# Patient Record
Sex: Male | Born: 1975 | Race: Black or African American | Hispanic: No | Smoking: Former smoker
Health system: Southern US, Community
[De-identification: ages and names within clinical notes are randomized; demographics above are authoritative.]

## PROBLEM LIST (undated history)

## (undated) DIAGNOSIS — M109 Gout, unspecified: Secondary | ICD-10-CM

## (undated) DIAGNOSIS — Z789 Other specified health status: Secondary | ICD-10-CM

## (undated) DIAGNOSIS — G4733 Obstructive sleep apnea (adult) (pediatric): Secondary | ICD-10-CM

## (undated) DIAGNOSIS — K819 Cholecystitis, unspecified: Secondary | ICD-10-CM

## (undated) DIAGNOSIS — T7840XA Allergy, unspecified, initial encounter: Secondary | ICD-10-CM

## (undated) DIAGNOSIS — J342 Deviated nasal septum: Secondary | ICD-10-CM

## (undated) DIAGNOSIS — I10 Essential (primary) hypertension: Secondary | ICD-10-CM

## (undated) DIAGNOSIS — E119 Type 2 diabetes mellitus without complications: Secondary | ICD-10-CM

## (undated) DIAGNOSIS — K219 Gastro-esophageal reflux disease without esophagitis: Secondary | ICD-10-CM

## (undated) HISTORY — DX: Allergy, unspecified, initial encounter: T78.40XA

## (undated) HISTORY — DX: Essential (primary) hypertension: I10

## (undated) HISTORY — DX: Deviated nasal septum: J34.2

## (undated) HISTORY — DX: Cholecystitis, unspecified: K81.9

## (undated) HISTORY — DX: Obstructive sleep apnea (adult) (pediatric): G47.33

## (undated) HISTORY — PX: NO PAST SURGERIES: SHX2092

## (undated) HISTORY — DX: Gastro-esophageal reflux disease without esophagitis: K21.9

## (undated) HISTORY — DX: Type 2 diabetes mellitus without complications: E11.9

## (undated) HISTORY — DX: Gout, unspecified: M10.9

## (undated) HISTORY — PX: WISDOM TOOTH EXTRACTION: SHX21

---

## 2005-05-12 ENCOUNTER — Encounter: Admission: RE | Admit: 2005-05-12 | Discharge: 2005-05-12 | Payer: Self-pay | Admitting: Emergency Medicine

## 2011-04-13 DIAGNOSIS — J310 Chronic rhinitis: Secondary | ICD-10-CM | POA: Insufficient documentation

## 2013-06-15 ENCOUNTER — Ambulatory Visit: Payer: Self-pay | Admitting: Family Medicine

## 2015-05-07 ENCOUNTER — Ambulatory Visit
Admission: EM | Admit: 2015-05-07 | Discharge: 2015-05-07 | Disposition: A | Discharge status: Home / Self Care | Payer: 59 | Attending: Family Medicine | Admitting: Family Medicine

## 2015-05-07 DIAGNOSIS — J0100 Acute maxillary sinusitis, unspecified: Secondary | ICD-10-CM

## 2015-05-07 DIAGNOSIS — H6500 Acute serous otitis media, unspecified ear: Secondary | ICD-10-CM | POA: Diagnosis not present

## 2015-05-07 HISTORY — DX: Other specified health status: Z78.9

## 2015-05-07 MED ORDER — AZITHROMYCIN 250 MG PO TABS: ORAL_TABLET | ORAL | Status: DC

## 2015-05-07 NOTE — ED Provider Notes (Signed)
CSN: [id number]     Arrival date & time 05/07/15  1532 History   First MD Initiated Contact with Patient 05/07/15 1839     Chief Complaint  Patient presents with  . Sinusitis  . Otalgia   (Consider location/radiation/quality/duration/timing/severity/associated sxs/prior Treatment) Patient is a 40 y.o. male presenting with URI. The history is provided by the patient.  URI Presenting symptoms: congestion, cough, ear pain, facial pain and fatigue   Severity:  Moderate Onset quality:  Sudden Duration:  5 days Timing:  Constant Progression:  Worsening Chronicity:  New Relieved by:  Nothing Ineffective treatments:  OTC medications Associated symptoms: headaches and sinus pain     Past Medical History  Diagnosis Date  . Patient denies medical problems    Past Surgical History  Procedure Laterality Date  . No past surgeries     History reviewed. No pertinent family history. Social History  Substance Use Topics  . Smoking status: Never Smoker   . Smokeless tobacco: None  . Alcohol Use: 0.0 oz/week    0 Standard drinks or equivalent per week     Comment: a couple beers weekly    Review of Systems  Constitutional: Positive for fatigue.  HENT: Positive for congestion and ear pain.   Respiratory: Positive for cough.   Neurological: Positive for headaches.    Allergies  Amoxil  Home Medications   Prior to Admission medications   Medication Sig Start Date End Date Taking? Authorizing Provider  azithromycin (ZITHROMAX Z-PAK) 250 MG tablet 2 tabs po once day 1, then 1 tab po qd for next 4 days 05/07/15   Norval Gable, MD   Meds Ordered and Administered this Visit  Medications - No data to display  BP 142/88 mmHg  Pulse 86  Temp(Src) 98 F (36.7 C) (Tympanic)  Resp 17  Ht 6' 1.5" (1.867 m)  Wt 327 lb (148.326 kg)  BMI 42.55 kg/m2  SpO2 98% No data found.   Physical Exam  Constitutional: He appears well-developed and well-nourished. No distress.  HENT:  Head:  Normocephalic and atraumatic.  Right Ear: Tympanic membrane, external ear and ear canal normal.  Left Ear: External ear and ear canal normal. Tympanic membrane is injected, erythematous and bulging. A middle ear effusion is present.  Nose: Right sinus exhibits maxillary sinus tenderness and frontal sinus tenderness. Left sinus exhibits maxillary sinus tenderness and frontal sinus tenderness.  Mouth/Throat: Uvula is midline, oropharynx is clear and moist and mucous membranes are normal. No oropharyngeal exudate or tonsillar abscesses.  Eyes: Conjunctivae and EOM are normal. Pupils are equal, round, and reactive to light. Right eye exhibits no discharge. Left eye exhibits no discharge. No scleral icterus.  Neck: Normal range of motion. Neck supple. No tracheal deviation present. No thyromegaly present.  Cardiovascular: Normal rate, regular rhythm and normal heart sounds.   Pulmonary/Chest: Effort normal and breath sounds normal. No stridor. No respiratory distress. He has no wheezes. He has no rales. He exhibits no tenderness.  Lymphadenopathy:    He has no cervical adenopathy.  Neurological: He is alert.  Skin: Skin is warm and dry. No rash noted. He is not diaphoretic.  Nursing note and vitals reviewed.   ED Course  Procedures (including critical care time)  Labs Review Labs Reviewed - No data to display  Imaging Review No results found.   Visual Acuity Review  Right Eye Distance:   Left Eye Distance:   Bilateral Distance:    Right Eye Near:   Left Eye Near:  Bilateral Near:         MDM   1. Acute serous otitis media, recurrence not specified, unspecified laterality   2. Acute maxillary sinusitis, recurrence not specified    Discharge Medication List as of 05/07/2015  6:46 PM    START taking these medications   Details  azithromycin (ZITHROMAX Z-PAK) 250 MG tablet 2 tabs po once day 1, then 1 tab po qd for next 4 days, Normal       1. diagnosis reviewed with  patient 2. rx as per orders above; reviewed possible side effects, interactions, risks and benefits  3. Recommend supportive treatment with otc analgesics, otc flonase 4. Follow-up prn if symptoms worsen or Dahmir't improve    Norval Gable, MD 05/07/15 [phone]

## 2015-05-07 NOTE — ED Notes (Signed)
Patient complains of ear pressure and coughing. States that this started on Thursday after a 6 hour flight from [place]. Patient states that he has been having a sore throat and states that he attributes this to the drainage. Patient states that he has sinus pressure and ear popping as well.

## 2018-05-05 HISTORY — PX: CHOLECYSTECTOMY: SHX55

## 2018-05-07 DIAGNOSIS — Z860100 Personal history of colon polyps, unspecified: Secondary | ICD-10-CM

## 2018-05-07 DIAGNOSIS — K635 Polyp of colon: Secondary | ICD-10-CM | POA: Insufficient documentation

## 2018-05-07 DIAGNOSIS — E785 Hyperlipidemia, unspecified: Secondary | ICD-10-CM | POA: Insufficient documentation

## 2018-05-07 DIAGNOSIS — Z9889 Other specified postprocedural states: Secondary | ICD-10-CM | POA: Insufficient documentation

## 2018-05-07 DIAGNOSIS — I10 Essential (primary) hypertension: Secondary | ICD-10-CM | POA: Insufficient documentation

## 2018-05-07 HISTORY — DX: Personal history of colon polyps, unspecified: Z86.0100

## 2018-05-07 HISTORY — DX: Personal history of colonic polyps: Z98.890

## 2018-05-25 DIAGNOSIS — J40 Bronchitis, not specified as acute or chronic: Secondary | ICD-10-CM

## 2018-05-25 HISTORY — DX: Bronchitis, not specified as acute or chronic: J40

## 2018-06-07 DIAGNOSIS — D369 Benign neoplasm, unspecified site: Secondary | ICD-10-CM

## 2018-06-07 HISTORY — DX: Benign neoplasm, unspecified site: D36.9

## 2018-11-04 DIAGNOSIS — M7662 Achilles tendinitis, left leg: Secondary | ICD-10-CM

## 2018-11-04 DIAGNOSIS — Z23 Encounter for immunization: Secondary | ICD-10-CM | POA: Insufficient documentation

## 2018-11-04 DIAGNOSIS — R7303 Prediabetes: Secondary | ICD-10-CM | POA: Insufficient documentation

## 2018-11-04 DIAGNOSIS — E118 Type 2 diabetes mellitus with unspecified complications: Secondary | ICD-10-CM | POA: Insufficient documentation

## 2018-11-04 DIAGNOSIS — Z6841 Body Mass Index (BMI) 40.0 and over, adult: Secondary | ICD-10-CM | POA: Insufficient documentation

## 2018-11-04 HISTORY — DX: Achilles tendinitis, left leg: M76.62

## 2019-04-08 ENCOUNTER — Other Ambulatory Visit: Payer: Self-pay

## 2019-04-08 DIAGNOSIS — Z20822 Contact with and (suspected) exposure to covid-19: Secondary | ICD-10-CM

## 2019-04-10 LAB — NOVEL CORONAVIRUS, NAA: SARS-CoV-2, NAA: NOT DETECTED

## 2019-04-30 DIAGNOSIS — K8050 Calculus of bile duct without cholangitis or cholecystitis without obstruction: Secondary | ICD-10-CM | POA: Insufficient documentation

## 2019-04-30 DIAGNOSIS — Z9049 Acquired absence of other specified parts of digestive tract: Secondary | ICD-10-CM | POA: Insufficient documentation

## 2019-04-30 HISTORY — DX: Acquired absence of other specified parts of digestive tract: Z90.49

## 2019-04-30 HISTORY — DX: Calculus of bile duct without cholangitis or cholecystitis without obstruction: K80.50

## 2019-05-05 DIAGNOSIS — K5909 Other constipation: Secondary | ICD-10-CM | POA: Insufficient documentation

## 2019-05-10 DIAGNOSIS — K8050 Calculus of bile duct without cholangitis or cholecystitis without obstruction: Secondary | ICD-10-CM | POA: Insufficient documentation

## 2019-06-28 DIAGNOSIS — L989 Disorder of the skin and subcutaneous tissue, unspecified: Secondary | ICD-10-CM

## 2019-06-28 HISTORY — DX: Disorder of the skin and subcutaneous tissue, unspecified: L98.9

## 2020-04-07 ENCOUNTER — Other Ambulatory Visit: Payer: 59

## 2020-04-07 DIAGNOSIS — Z20822 Contact with and (suspected) exposure to covid-19: Secondary | ICD-10-CM

## 2020-04-09 LAB — SARS-COV-2, NAA 2 DAY TAT

## 2020-04-09 LAB — NOVEL CORONAVIRUS, NAA: SARS-CoV-2, NAA: NOT DETECTED

## 2021-05-30 ENCOUNTER — Other Ambulatory Visit: Payer: Self-pay

## 2021-05-30 ENCOUNTER — Ambulatory Visit
Admission: RE | Admit: 2021-05-30 | Discharge: 2021-05-30 | Disposition: A | Discharge status: Home / Self Care | Payer: 59 | Attending: Family Medicine | Admitting: Family Medicine

## 2021-05-30 ENCOUNTER — Ambulatory Visit
Admission: RE | Admit: 2021-05-30 | Discharge: 2021-05-30 | Disposition: A | Discharge status: Home / Self Care | Payer: 59 | Source: Ambulatory Visit | Attending: Family Medicine | Admitting: Family Medicine

## 2021-05-30 ENCOUNTER — Ambulatory Visit: Payer: 59 | Admitting: Family Medicine

## 2021-05-30 ENCOUNTER — Encounter: Payer: Self-pay | Admitting: Family Medicine

## 2021-05-30 VITALS — BP 152/102 | HR 72 | Ht 73.5 in | Wt 338.0 lb

## 2021-05-30 DIAGNOSIS — G4733 Obstructive sleep apnea (adult) (pediatric): Secondary | ICD-10-CM

## 2021-05-30 DIAGNOSIS — I10 Essential (primary) hypertension: Secondary | ICD-10-CM | POA: Diagnosis not present

## 2021-05-30 DIAGNOSIS — M5416 Radiculopathy, lumbar region: Secondary | ICD-10-CM

## 2021-05-30 DIAGNOSIS — J310 Chronic rhinitis: Secondary | ICD-10-CM

## 2021-05-30 DIAGNOSIS — J309 Allergic rhinitis, unspecified: Secondary | ICD-10-CM | POA: Diagnosis not present

## 2021-05-30 DIAGNOSIS — Z113 Encounter for screening for infections with a predominantly sexual mode of transmission: Secondary | ICD-10-CM | POA: Insufficient documentation

## 2021-05-30 DIAGNOSIS — R5382 Chronic fatigue, unspecified: Secondary | ICD-10-CM | POA: Diagnosis not present

## 2021-05-30 DIAGNOSIS — Z9989 Dependence on other enabling machines and devices: Secondary | ICD-10-CM

## 2021-05-30 MED ORDER — FLUNISOLIDE 25 MCG/ACT (0.025%) NA SOLN: 2.0000 | Freq: Two times a day (BID) | NASAL | 2 refills | Status: DC

## 2021-05-30 MED ORDER — LOSARTAN POTASSIUM-HCTZ 100-25 MG PO TABS: 1.0000 | ORAL_TABLET | Freq: Every day | ORAL | 2 refills | Status: DC

## 2021-05-30 MED ORDER — CYCLOBENZAPRINE HCL 10 MG PO TABS: 10.0000 mg | ORAL_TABLET | Freq: Three times a day (TID) | ORAL | 0 refills | Status: DC | PRN

## 2021-05-30 NOTE — Assessment & Plan Note (Signed)
Chronic condition ongoing for 3 years, this is in the setting of multiple comorbid medical factors inclusive of OSA with partial compliance of CPAP.  He states that he gets roughly 5 hours of sleep a night attributing this to work-related stressors, during that sleep he is partially compliant with CPAP.  He does state that the nights that he does utilize a CPAP he has excellent sleep.  He has daytime somnolence reported.  Given his comorbid lower extremity paresthesia, plan for review of additional etiologies for his chronic fatigue including TSH, iron, B12, CBC studies.  We will follow these results and review at his next visit.

## 2021-05-30 NOTE — Assessment & Plan Note (Signed)
Chronic issue, uncontrolled.  We discussed the significance of untreated OSA in relation to mood, energy, hypertension, and obesity.  Barrier to compliance include knowledge about the medical condition, comorbid chronic rhinitis.  I have placed a referral to ENT/sleep medicine for further evaluation and management.  He is amenable to restart of consistent usage of his CPAP.

## 2021-05-30 NOTE — Assessment & Plan Note (Signed)
Chronic issue, uncontrolled.  Patient states that he is intermittently compliant with blood pressure medication regimen, denies any chest pain, does have mild shortness of air secondary to chronic rhinitis and recent sinus infection, and does endorse fatigue.  Cardiopulmonary examination today reveals positive S1 and S2, regular rate and rhythm, no additional heart sounds, clear lung fields throughout without wheezes, bibasilar rales, rhonchi, he has no JVD, no carotid bruits, symmetric pulses in the extremities, he does have trace pitting edema bilaterally at the ankles.  He stated barrier to medication compliance is the number of medications; to get a better gauge of his actual blood pressure following consistent treatment, I have advised combo ARB/HCTZ pill, discontinuation of beta-blocker, risk stratification labs, and close follow-up in 1 month for reevaluation and possible titration of medications.  Additionally, compliance with CPAP should help this issue, we discussed the same.

## 2021-05-30 NOTE — Patient Instructions (Addendum)
-   Obtain fasting labs with orders provided (can have water or black coffee but otherwise no food or drink x 8 hours before labs) - Obtain low back x-rays downstairs today - Start new, or blood pressure medication in place of your 3 others - Start new intranasal steroid in place of Flonase - Can dose cyclobenzaprine on an as-needed basis for low back/leg symptoms - Stop over-the-counter medications containing phenylephrine - Referral coordinator will contact you in regards to follow-up with ENT/sleep medicine and physical therapy  - Return for follow-up in 4 weeks, contact for any questions between now and then

## 2021-05-30 NOTE — Progress Notes (Signed)
Primary Care / Sports Medicine Office Visit  Patient Information:  Patient ID: Travis Petty, male DOB: 10/28/1975 Age: 46 y.o. MRN: [id number]   Travis Petty is a pleasant 46 y.o. male presenting with the following:  Chief Complaint  Patient presents with   New Patient (Initial Visit)   Establish Care    Vitals:   05/30/21 1026  BP: (!) 152/102  Pulse: 72  SpO2: 97%   Vitals:   05/30/21 1026  Weight: (!) 338 lb (153.3 kg)  Height: 6' 1.5" (1.867 m)   Body mass index is 43.99 kg/m.  No results found.   Independent interpretation of notes and tests performed by another provider:   None  Procedures performed:   None  Pertinent History, Exam, Impression, and Recommendations:   Chronic rhinitis Chronic issue, ongoing for years, has had multiple episodes of sinus infections, this is also noted in the setting of deviated septum.  He has recently completed a course of antibiotics from the late 04/2021 timeframe, has overall noted improvement but has consistent drainage that he has been addressing with as needed dosing of Flonase and guaifenesin with phenylephrine.  He has audible congestion, clearing of nares, throat with clear lung fields throughout.  I have advised a transition from Flonase to Rx flunisolide to dose daily, discontinuation of phenylephrine containing substances due to comorbid hypertension, compliance with CPAP nightly, and a referral to ENT/sleep medicine for additional evaluation management options.  Primary hypertension Chronic issue, uncontrolled.  Patient states that he is intermittently compliant with blood pressure medication regimen, denies any chest pain, does have mild shortness of air secondary to chronic rhinitis and recent sinus infection, and does endorse fatigue.  Cardiopulmonary examination today reveals positive S1 and S2, regular rate and rhythm, no additional heart sounds, clear lung fields throughout without wheezes, bibasilar  rales, rhonchi, he has no JVD, no carotid bruits, symmetric pulses in the extremities, he does have trace pitting edema bilaterally at the ankles.  He stated barrier to medication compliance is the number of medications; to get a better gauge of his actual blood pressure following consistent treatment, I have advised combo ARB/HCTZ pill, discontinuation of beta-blocker, risk stratification labs, and close follow-up in 1 month for reevaluation and possible titration of medications.  Additionally, compliance with CPAP should help this issue, we discussed the same.  OSA on CPAP Chronic issue, uncontrolled.  We discussed the significance of untreated OSA in relation to mood, energy, hypertension, and obesity.  Barrier to compliance include knowledge about the medical condition, comorbid chronic rhinitis.  I have placed a referral to ENT/sleep medicine for further evaluation and management.  He is amenable to restart of consistent usage of his CPAP.  Chronic left-sided lumbar radiculopathy Chronic issue, atraumatic in nature, ongoing since 2016 timeframe where he had severe back pain.  Since that time he has noted improvement with intermittent flares every few months-years.  He has had a "sciatica "episode towards the latter part of last year alleviated with skeletal muscle relaxers and stretching.  He currently endorses left lateral foot tingling, intermittent in nature, noted with prolonged standing or sitting.  Examination reveals significantly tight posterior chain musculature, positive straight leg raise on the left, right benign.  Patient's clinical history and findings are most consistent with lumbosacral left-sided radiculopathy.  We will further evaluate this with dedicated x-rays of the lumbar spine.  Due to his comorbid uncontrolled hypertension, will refrain from NSAIDs, plan for as needed skeletal muscle relaxer  which was prescribed today.  Additionally, a referral to physical therapy was placed to  address modifiable risk factors such as flexibility, stabilization, and gentle strengthening.  Encounter for screening examination for sexually transmitted disease Patient denies any active symptoms inclusive of pelvic or abdominal pain, dysuria, urinary discharge, nausea, emesis, change in bowels, no skin lesions.  He does state that he has had multiple sexual partners over the past years and is interested in testing.  Appropriate testing ordered today.  Chronic fatigue Chronic condition ongoing for 3 years, this is in the setting of multiple comorbid medical factors inclusive of OSA with partial compliance of CPAP.  He states that he gets roughly 5 hours of sleep a night attributing this to work-related stressors, during that sleep he is partially compliant with CPAP.  He does state that the nights that he does utilize a CPAP he has excellent sleep.  He has daytime somnolence reported.  Given his comorbid lower extremity paresthesia, plan for review of additional etiologies for his chronic fatigue including TSH, iron, B12, CBC studies.  We will follow these results and review at his next visit.   I provided a total time of 62 minutes including both face-to-face and non-face-to-face time on 05/30/2021 inclusive of time utilized for medical chart review, information gathering, care coordination with staff, and documentation completion.  Specifically, we discussed the interplay of obstructive sleep apnea and obesity, hypertension, fatigue, and mood.  Additionally, how to address barriers to medication and CPAP compliance, discussion about appropriate work-up and treatment options, coming up with a shared decision for medical plan.   Orders & Medications Meds ordered this encounter  Medications   losartan-hydrochlorothiazide (HYZAAR) 100-25 MG tablet    Sig: Take 1 tablet by mouth daily.    Dispense:  30 tablet    Refill:  2   flunisolide (NASALIDE) 25 MCG/ACT (0.025%) SOLN    Sig: Place 2 sprays  into the nose 2 (two) times daily.    Dispense:  25 mL    Refill:  2   cyclobenzaprine (FLEXERIL) 10 MG tablet    Sig: Take 1 tablet (10 mg total) by mouth 3 (three) times daily as needed for muscle spasms.    Dispense:  30 tablet    Refill:  0   Orders Placed This Encounter  Procedures   GC/Chlamydia Probe Amp   DG Lumbar Spine Complete   Lipid panel   Apo A1 + B + Ratio   TSH   Comprehensive metabolic panel   CBC with Differential/Platelet   Iron, TIBC and Ferritin Panel   B12 and Folate Panel   HepB+HepC+HIV Panel   Ambulatory referral to ENT   Ambulatory referral to Physical Therapy     Return in about 4 weeks (around 06/27/2021).     [name], MD   Primary Care Sports [facility] [place]

## 2021-05-30 NOTE — Assessment & Plan Note (Signed)
Chronic issue, atraumatic in nature, ongoing since 2016 timeframe where he had severe back pain.  Since that time he has noted improvement with intermittent flares every few months-years.  He has had a "sciatica "episode towards the latter part of last year alleviated with skeletal muscle relaxers and stretching.  He currently endorses left lateral foot tingling, intermittent in nature, noted with prolonged standing or sitting.  Examination reveals significantly tight posterior chain musculature, positive straight leg raise on the left, right benign.  Patient's clinical history and findings are most consistent with lumbosacral left-sided radiculopathy.  We will further evaluate this with dedicated x-rays of the lumbar spine.  Due to his comorbid uncontrolled hypertension, will refrain from NSAIDs, plan for as needed skeletal muscle relaxer which was prescribed today.  Additionally, a referral to physical therapy was placed to address modifiable risk factors such as flexibility, stabilization, and gentle strengthening.

## 2021-05-30 NOTE — Assessment & Plan Note (Signed)
Patient denies any active symptoms inclusive of pelvic or abdominal pain, dysuria, urinary discharge, nausea, emesis, change in bowels, no skin lesions.  He does state that he has had multiple sexual partners over the past years and is interested in testing.  Appropriate testing ordered today.

## 2021-05-30 NOTE — Assessment & Plan Note (Signed)
Chronic issue, ongoing for years, has had multiple episodes of sinus infections, this is also noted in the setting of deviated septum.  He has recently completed a course of antibiotics from the late 04/2021 timeframe, has overall noted improvement but has consistent drainage that he has been addressing with as needed dosing of Flonase and guaifenesin with phenylephrine.  He has audible congestion, clearing of nares, throat with clear lung fields throughout.  I have advised a transition from Flonase to Rx flunisolide to dose daily, discontinuation of phenylephrine containing substances due to comorbid hypertension, compliance with CPAP nightly, and a referral to ENT/sleep medicine for additional evaluation management options.

## 2021-05-31 LAB — HEPB+HEPC+HIV PANEL
HIV Screen 4th Generation wRfx: NONREACTIVE
Hep B C IgM: NEGATIVE
Hep B Core Total Ab: NEGATIVE
Hep B E Ab: NEGATIVE
Hep B E Ag: NEGATIVE
Hep B Surface Ab, Qual: NONREACTIVE
Hep C Virus Ab: <0.1 s/co ratio (ref 0.0–0.9)
Hepatitis B Surface Ag: NEGATIVE

## 2021-05-31 LAB — B12 AND FOLATE PANEL
Folate: 7.9 ng/mL (ref >3.0)
Vitamin B-12: 462 pg/mL (ref 232–1245)

## 2021-05-31 LAB — LIPID PANEL
Chol/HDL Ratio: 4.2 ratio (ref 0.0–5.0)
Cholesterol, Total: 191 mg/dL (ref 100–199)
HDL: 45 mg/dL (ref >39)
LDL Chol Calc (NIH): 123 mg/dL — ABNORMAL HIGH (ref 0–99)
Triglycerides: 127 mg/dL (ref 0–149)
VLDL Cholesterol Cal: 23 mg/dL (ref 5–40)

## 2021-05-31 LAB — CBC WITH DIFFERENTIAL/PLATELET
Basophils Absolute: 0 10*3/uL (ref 0.0–0.2)
Basos: 1 %
EOS (ABSOLUTE): 0.2 10*3/uL (ref 0.0–0.4)
Eos: 3 %
Hematocrit: 43.4 % (ref 37.5–51.0)
Hemoglobin: 15.3 g/dL (ref 13.0–17.7)
Immature Grans (Abs): 0 10*3/uL (ref 0.0–0.1)
Immature Granulocytes: 0 %
Lymphocytes Absolute: 1.9 10*3/uL (ref 0.7–3.1)
Lymphs: 34 %
MCH: 28.7 pg (ref 26.6–33.0)
MCHC: 35.3 g/dL (ref 31.5–35.7)
MCV: 81 fL (ref 79–97)
Monocytes Absolute: 0.3 10*3/uL (ref 0.1–0.9)
Monocytes: 6 %
Neutrophils Absolute: 3.1 10*3/uL (ref 1.4–7.0)
Neutrophils: 56 %
Platelets: 256 10*3/uL (ref 150–450)
RBC: 5.34 x10E6/uL (ref 4.14–5.80)
RDW: 13.4 % (ref 11.6–15.4)
WBC: 5.5 10*3/uL (ref 3.4–10.8)

## 2021-05-31 LAB — COMPREHENSIVE METABOLIC PANEL
ALT: 26 IU/L (ref 0–44)
AST: 18 IU/L (ref 0–40)
Albumin/Globulin Ratio: 1.3 (ref 1.2–2.2)
Albumin: 4.3 g/dL (ref 4.0–5.0)
Alkaline Phosphatase: 82 IU/L (ref 44–121)
BUN/Creatinine Ratio: 9 (ref 9–20)
BUN: 10 mg/dL (ref 6–24)
Bilirubin Total: 1.2 mg/dL (ref 0.0–1.2)
CO2: 28 mmol/L (ref 20–29)
Calcium: 9.5 mg/dL (ref 8.7–10.2)
Chloride: 99 mmol/L (ref 96–106)
Creatinine, Ser: 1.07 mg/dL (ref 0.76–1.27)
Globulin, Total: 3.2 g/dL (ref 1.5–4.5)
Glucose: 127 mg/dL — ABNORMAL HIGH (ref 70–99)
Potassium: 3.9 mmol/L (ref 3.5–5.2)
Sodium: 139 mmol/L (ref 134–144)
Total Protein: 7.5 g/dL (ref 6.0–8.5)
eGFR: 87 mL/min/{1.73_m2} (ref >59)

## 2021-05-31 LAB — TSH: TSH: 1.91 u[IU]/mL (ref 0.450–4.500)

## 2021-05-31 LAB — IRON,TIBC AND FERRITIN PANEL
Ferritin: 188 ng/mL (ref 30–400)
Iron Saturation: 38 % (ref 15–55)
Iron: 102 ug/dL (ref 38–169)
Total Iron Binding Capacity: 266 ug/dL (ref 250–450)
UIBC: 164 ug/dL (ref 111–343)

## 2021-05-31 LAB — APO A1 + B + RATIO
Apolipo. B/A-1 Ratio: 0.7 ratio (ref 0.0–0.7)
Apolipoprotein A-1: 134 mg/dL (ref 101–178)
Apolipoprotein B: 96 mg/dL — ABNORMAL HIGH (ref <90)

## 2021-06-01 ENCOUNTER — Encounter: Payer: Self-pay | Admitting: Family Medicine

## 2021-06-01 LAB — HGB A1C W/O EAG: Hgb A1c MFr Bld: 6.8 % — ABNORMAL HIGH (ref 4.8–5.6)

## 2021-06-01 LAB — SPECIMEN STATUS REPORT

## 2021-06-03 ENCOUNTER — Other Ambulatory Visit: Payer: Self-pay | Admitting: Family Medicine

## 2021-06-03 DIAGNOSIS — E118 Type 2 diabetes mellitus with unspecified complications: Secondary | ICD-10-CM

## 2021-06-03 LAB — GC/CHLAMYDIA PROBE AMP
Chlamydia trachomatis, NAA: NEGATIVE
Neisseria Gonorrhoeae by PCR: NEGATIVE

## 2021-06-03 MED ORDER — METFORMIN HCL ER 500 MG PO TB24: 500.0000 mg | ORAL_TABLET | Freq: Every day | ORAL | 2 refills | Status: DC

## 2021-06-03 NOTE — Telephone Encounter (Signed)
Please advise for lab results.

## 2021-06-04 ENCOUNTER — Ambulatory Visit: Payer: 59 | Attending: Family Medicine

## 2021-06-04 ENCOUNTER — Other Ambulatory Visit: Payer: Self-pay

## 2021-06-04 DIAGNOSIS — M6281 Muscle weakness (generalized): Secondary | ICD-10-CM | POA: Insufficient documentation

## 2021-06-04 DIAGNOSIS — G8929 Other chronic pain: Secondary | ICD-10-CM | POA: Diagnosis present

## 2021-06-04 DIAGNOSIS — M5442 Lumbago with sciatica, left side: Secondary | ICD-10-CM | POA: Diagnosis present

## 2021-06-04 DIAGNOSIS — M5416 Radiculopathy, lumbar region: Secondary | ICD-10-CM | POA: Diagnosis present

## 2021-06-04 NOTE — Patient Instructions (Signed)
Access Code: [email or web address] URL: [email or web address] Date: 06/04/2021 Prepared by: Roxana Hires  Exercises Supine Figure 4 Piriformis Stretch - 1 x daily - 7 x weekly - 3 sets - 30-45s hold Supine Piriformis Stretch with Foot on Ground - 1 x daily - 7 x weekly - 3 sets - 30-45s hold Seated Flexion Stretch - 1 x daily - 7 x weekly - 3 sets - 30-45s hold
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
## 2021-06-11 ENCOUNTER — Encounter: Payer: Self-pay | Admitting: Family Medicine

## 2021-06-11 ENCOUNTER — Ambulatory Visit: Payer: 59 | Attending: Family Medicine

## 2021-06-11 ENCOUNTER — Other Ambulatory Visit: Payer: Self-pay

## 2021-06-11 DIAGNOSIS — M5416 Radiculopathy, lumbar region: Secondary | ICD-10-CM | POA: Diagnosis not present

## 2021-06-11 DIAGNOSIS — G8929 Other chronic pain: Secondary | ICD-10-CM | POA: Diagnosis present

## 2021-06-11 DIAGNOSIS — M5442 Lumbago with sciatica, left side: Secondary | ICD-10-CM | POA: Diagnosis present

## 2021-06-11 DIAGNOSIS — M6281 Muscle weakness (generalized): Secondary | ICD-10-CM | POA: Diagnosis present

## 2021-06-11 DIAGNOSIS — I10 Essential (primary) hypertension: Secondary | ICD-10-CM

## 2021-06-11 MED ORDER — LOSARTAN POTASSIUM-HCTZ 100-25 MG PO TABS: 1.0000 | ORAL_TABLET | Freq: Every day | ORAL | 2 refills | Status: DC

## 2021-06-11 NOTE — Therapy (Addendum)
[facility] [address] [place], [place], [address] Phone: [phone]   Fax:  [phone]  Physical Therapy Treatment  Patient Details  Name: Travis Petty MRN: [id number] Date of Birth: [birth year] Referring Provider (PT): Dr. Zigmund Daniel   Encounter Date: 06/11/2021   PT End of Session - 06/11/21 1253     Visit Number 2    Number of Visits 13    Date for PT Re-Evaluation 07/16/21    Authorization Type eval: 06/04/2021    PT Start Time 1145    PT Stop Time 1230    PT Time Calculation (min) 45 min    Activity Tolerance Patient tolerated treatment well    Behavior During Therapy [facility] for tasks assessed/performed             Past Medical History:  Diagnosis Date   Allergy    Bronchitis 05/25/2018   Last Assessment & Plan:  Formatting of this note might be different from the original. Reviewed hx, sx, exam with pt Proceed with antibx With recent doxycycline and allergies, will proceed with rx levofloxacin. Also included short burst of po steroid. If symptoms not improved in next 3-5 days with treatment/plan recommended, patient was advised to follow up with office for further recommendations.   Cholecystitis    Deviated septum    GERD (gastroesophageal reflux disease)    Gout    Left Achilles tendinitis 11/04/2018   Last Assessment & Plan:  Formatting of this note might be different from the original. Follow up with Ortho   OSA on CPAP    Skin lesions 06/28/2019   Last Assessment & Plan:  Formatting of this note might be different from the original. This appears benign just monitor.    Past Surgical History:  Procedure Laterality Date   CHOLECYSTECTOMY  2020   WISDOM TOOTH EXTRACTION      There were no vitals filed for this visit.   Subjective Assessment - 06/11/21 1148     Subjective Pt had flare up of gout and swelling of L foot over the weekend. Swelling and pain have decreased, but not entirely gone. Pt reports no  flare ups of back or thigh during his car trip to [place]. Pt states the tingling in the 2nd toe is still present.    Pertinent History Pt has history of L side lumbar radiculopathy / sciatica for several years that presents as occasional bouts of sharp lower L lumbar pain and an intense burning pain down the posterior aspect of his L thigh. Current bout of pain has been on and off for the past few months. This bout has also presented itself with semi-constant tingling in his 4th phalanx of his L foot. L foot does not have any sensation loss or pain, but pt describes the sensation "like electricity buzzing". Lower back and thigh pain only occurs occasionally and when not acting up pt reports 0/10 pain. For this current episode pt went to his provider and they prescribed him muscle relaxers which has not helped. Pt not a big fan of medicine so only takes the muscle relaxers when the pain becomes unbearable. Motions that cause the L lower back and thigh pain to flare up are: sitting too long in an uncomfortable chair, walking for long periods of time, exercising at the gym, twisting motions, and engaging in sexual activity with his girlfriend. When pain increases pt mainly utilizes stretches to help it subside. Pt has a lot of traveling  coming up and does not want to experience a flare up during travel. Pt works for a [employer] and is sitting most of the day at work. Pt enjoys being active and exercising.    Limitations Sitting;Standing;Walking              TREATMENT   Manual Therapy: CPA Lumbar L1-L5 mobilizations grade II-III 30s/bouts x 2 bouts L A/P Hip mobilizations grade II-III 30s/bouts x 2 bouts L M/L Hip mobilizations with belt grade II-III 30s/bouts x 2 bouts L Hip Long Axis distraction with belt 30s/bouts x 2 bouts SLR passive stretch with added nerve glide hold 5s x 5 Seated slump nerve glide hold 5s x 5 HEP issued;   There Ex: R SLR x 10; Seated marches x  20;   Trigger Point Dry Needling (TDN), unbilled Education performed with patient regarding potential benefit of TDN. Reviewed precautions and risks with patient. Extensive time spent with pt to ensure full understanding of TDN risks. Pt provided verbal consent to treatment. TDN performed to bilateral lumbar multifidi at L4 with 2, 0.35 x 75 single needle placements (one on each side). Pistoning technique utilized.    Pt educated throughout session about proper posture and technique with exercises. Improved exercise technique, movement at target joints, use of target muscles after min to mod verbal, visual, tactile cues.    Focused session on increasing ROM with mobs throughout the lumbar spine and L hip. Pt reported only a slight discomfort during lumbar mobs, but denied any pain. Pt reported feeling a slight relief of tingling in the 2nd phalanx during L long axis distraction. Performed 2 nerve glides to help release some of the tension within the nerve along the back on the L leg. During seated marches pt's R leg externally rotates as he flexes the hip up. Added exercises and a nerve glide to pt's HEP and instructed him on positioning and doseage. Pt would benefit from continued PT services to address deficits in ROM, strength and pain in order to return to full function at home and work.                   PT Short Term Goals - 06/04/21 1342       PT SHORT TERM GOAL #1   Title Pt will be independent with HEP in order to improve strength and decrease L lumbar back pain in order to improve pain-free function at home and work.    Time 3    Period Weeks    Status New    Target Date 06/25/21               PT Long Term Goals - 06/04/21 1457       PT LONG TERM GOAL #1   Title Pt will increase lumbar flexion by 10 degrees to show a significant increase in range of motion in order to increase ability to perform functional activities at work and home like tieing his shoes.     Baseline 06/04/2021: 35    Time 6    Period Weeks    Status New    Target Date 07/16/21      PT LONG TERM GOAL #2   Title Pt will decrease worst back pain as reported on NPRS by at least 2 points in order to demonstrate clinically significant reduction in his L lower back pain.    Baseline Ask next session    Time 6    Period Weeks  Status New    Target Date 07/16/21      PT LONG TERM GOAL #3   Title Pt will increase FOTO to at least 75 in order to demonstrate significant improvement in function related to L low back pain.    Baseline 06/04/2021: 67    Time 6    Period Weeks    Status New    Target Date 07/16/21                   Plan - 06/11/21 1254     Clinical Impression Statement Focused session on increasing ROM with mobs throughout the lumbar spine and L hip. Pt reported only a slight discomfort during lumbar mobs, but denied any pain. Pt reported feeling a slight relief of tingling in the 2nd phalanx during L long axis distraction. Performed 2 nerve glides to help release some of the tension within the nerve along the back on the L leg. During seated marches pt's R leg externally rotates as he flexes the hip up. Added exercises and a nerve glide to pt's HEP and instructed him on positioning and doseage. Pt would benefit from continued PT services to address deficits in ROM, strength and pain in order to return to full function at home and work.    Personal Factors and Comorbidities Comorbidity 1    Comorbidities Chronic left-sided lumbar radiculopathy    Examination-Activity Limitations Sit;Stand;Other    Examination-Participation Restrictions Driving;Interpersonal Relationship;Occupation;Other   Traveling   Stability/Clinical Decision Making Evolving/Moderate complexity    Rehab Potential Good    PT Frequency 2x / week    PT Duration 6 weeks    PT Treatment/Interventions Cryotherapy;Electrical Stimulation;Iontophoresis 4mg /ml Dexamethasone;Moist  Heat;Traction;Ultrasound;Therapeutic activities;Therapeutic exercise;Neuromuscular re-education;Manual techniques;Dry needling;Spinal Manipulations;Joint Manipulations;Passive range of motion    PT Next Visit Plan Complete mODI, work on gentle strengthening and stretching    PT Home Exercise Plan Access code: [email or web address]    Consulted and Agree with Plan of Care Patient             Patient will benefit from skilled therapeutic intervention in order to improve the following deficits and impairments:  Decreased range of motion, Decreased strength, Pain  Visit Diagnosis: Radiculopathy, lumbar region  Chronic left-sided low back pain with left-sided sciatica  Muscle weakness (generalized)     Problem List Patient Active Problem List   Diagnosis Date Noted   OSA on CPAP 05/30/2021   Chronic fatigue 05/30/2021   Allergic rhinitis 05/30/2021   Chronic left-sided lumbar radiculopathy 05/30/2021   Encounter for screening examination for sexually transmitted disease 77/41/2878   Biliary colic 67/67/2094   Other constipation 05/05/2019   Bilirubinemia 04/30/2019   Choledocholithiasis 04/30/2019   S/P laparoscopic cholecystectomy 04/30/2019   BMI 45.0-49.9, adult ([place]) 11/04/2018   Need for Tdap vaccination 11/04/2018   Prediabetes 11/04/2018   Adenomatous polyps 06/07/2018   Colon polyp 05/07/2018   Dyslipidemia 05/07/2018   Primary hypertension 05/07/2018   H/O colonoscopy with polypectomy 05/07/2018   Chronic rhinitis 04/13/2011    Onaje Warne SPT [facility] PT, DPT, GCS  Huprich,Jason, PT 06/11/2021, 2:59 PM  [facility] [address] [place], [place], [address] Phone: [phone]   Fax:  [phone]  Name: Nefi Musich MRN: [id number] Date of Birth: 1976-01-22

## 2021-06-17 ENCOUNTER — Other Ambulatory Visit: Payer: Self-pay

## 2021-06-17 DIAGNOSIS — I10 Essential (primary) hypertension: Secondary | ICD-10-CM

## 2021-06-17 MED ORDER — LOSARTAN POTASSIUM-HCTZ 100-25 MG PO TABS: 1.0000 | ORAL_TABLET | Freq: Every day | ORAL | 0 refills | Status: DC

## 2021-06-17 NOTE — Addendum Note (Signed)
Addended by: Forbes Cellar on: 06/17/2021 04:36 PM   Modules accepted: Orders

## 2021-06-25 ENCOUNTER — Ambulatory Visit: Payer: 59

## 2021-06-25 ENCOUNTER — Other Ambulatory Visit: Payer: Self-pay

## 2021-06-25 DIAGNOSIS — G8929 Other chronic pain: Secondary | ICD-10-CM

## 2021-06-25 DIAGNOSIS — M5416 Radiculopathy, lumbar region: Secondary | ICD-10-CM

## 2021-06-25 DIAGNOSIS — M6281 Muscle weakness (generalized): Secondary | ICD-10-CM

## 2021-06-25 NOTE — Therapy (Addendum)
[facility] [address] [place], [place], [address] Phone: [phone]   Fax:  [phone]  Physical Therapy Treatment  Patient Details  Name: Travis Petty MRN: [id number] Date of Birth: [birth year] Referring Provider (PT): Dr. Zigmund Daniel   Encounter Date: 06/25/2021   PT End of Session - 06/25/21 1147     Visit Number 3    Number of Visits 13    Date for PT Re-Evaluation 07/16/21    Authorization Type eval: 06/04/2021    PT Start Time 1147    PT Stop Time 1228    PT Time Calculation (min) 41 min    Activity Tolerance Patient tolerated treatment well    Behavior During Therapy [facility] for tasks assessed/performed             Past Medical History:  Diagnosis Date   Allergy    Bronchitis 05/25/2018   Last Assessment & Plan:  Formatting of this note might be different from the original. Reviewed hx, sx, exam with pt Proceed with antibx With recent doxycycline and allergies, will proceed with rx levofloxacin. Also included short burst of po steroid. If symptoms not improved in next 3-5 days with treatment/plan recommended, patient was advised to follow up with office for further recommendations.   Cholecystitis    Deviated septum    GERD (gastroesophageal reflux disease)    Gout    Left Achilles tendinitis 11/04/2018   Last Assessment & Plan:  Formatting of this note might be different from the original. Follow up with Ortho   OSA on CPAP    Skin lesions 06/28/2019   Last Assessment & Plan:  Formatting of this note might be different from the original. This appears benign just monitor.    Past Surgical History:  Procedure Laterality Date   CHOLECYSTECTOMY  2020   WISDOM TOOTH EXTRACTION      There were no vitals filed for this visit.   Subjective Assessment - 06/25/21 1205     Subjective Pt reports no pain upon arrival, but has been experiencing moderate amount of stiffness due to traveling a lot recently. Pt reports he  has been doing his HEP, but not as regularly as he should. Pt has no questions or concerns on his HEP.    Pertinent History Pt has history of L side lumbar radiculopathy / sciatica for several years that presents as occasional bouts of sharp lower L lumbar pain and an intense burning pain down the posterior aspect of his L thigh. Current bout of pain has been on and off for the past few months. This bout has also presented itself with semi-constant tingling in his 4th phalanx of his L foot. L foot does not have any sensation loss or pain, but pt describes the sensation "like electricity buzzing". Lower back and thigh pain only occurs occasionally and when not acting up pt reports 0/10 pain. For this current episode pt went to his provider and they prescribed him muscle relaxers which has not helped. Pt not a big fan of medicine so only takes the muscle relaxers when the pain becomes unbearable. Motions that cause the L lower back and thigh pain to flare up are: sitting too long in an uncomfortable chair, walking for long periods of time, exercising at the gym, twisting motions, and engaging in sexual activity with his girlfriend. When pain increases pt mainly utilizes stretches to help it subside. Pt has a lot of traveling coming up and does not want  to experience a flare up during travel. Pt works for a [employer] and is sitting most of the day at work. Pt enjoys being active and exercising.    Limitations Sitting;Standing;Walking              TREATMENT   Manual Therapy: CPA Lumbar L1-L5 mobilizations grade II-III 30s/bout x 2 bouts UPA L Lumbar L1-5 mobilizations grade II-II 30s/bout x 2 bouts L A/P Hip mobilizations grade II-III 30s/bouts x 3 bouts SLR passive stretch 3 x 30s SLR passive stretch with added nerve glide hold 5s x 5 Seated slump nerve glide hold 5s x 5 Standing lunge 6" step L hip flexor stretch 3 x 30s Seated hamstring stretch with L heel on 6" step 3 x  30s    There Ex: R SLR 2 x 10 Seated marches x 20 Yoga ball roll outs with L and R bends x 15 Mini squats x 10 L leg single leg 6" step ups 2 x 10    Pt educated throughout session about proper posture and technique with exercises. Improved exercise technique, movement at target joints, use of target muscles after min to mod verbal, visual, tactile cues.   Pt was moderately stiff due to doing a lot of traveling recently. Began session with lumbar and hip mobilizations to increase ROM. Focused session on alternating between stretching and light strengthening. Added in some bodyweight strengthening. Pt denied any increase in pain with strengthening exercises. HEP to be updated at next session in order to continue progressing strength and introduce more stretching that pt can complete at home. Pt would benefit from continued PT services in order to return to full function at home and work.    PT Short Term Goals - 06/04/21 1342       PT SHORT TERM GOAL #1   Title Pt will be independent with HEP in order to improve strength and decrease L lumbar back pain in order to improve pain-free function at home and work.    Time 3    Period Weeks    Status New    Target Date 06/25/21               PT Long Term Goals - 06/04/21 1457       PT LONG TERM GOAL #1   Title Pt will increase lumbar flexion by 10 degrees to show a significant increase in range of motion in order to increase ability to perform functional activities at work and home like tieing his shoes.    Baseline 06/04/2021: 35    Time 6    Period Weeks    Status New    Target Date 07/16/21      PT LONG TERM GOAL #2   Title Pt will decrease worst back pain as reported on NPRS by at least 2 points in order to demonstrate clinically significant reduction in his L lower back pain.    Baseline Ask next session    Time 6    Period Weeks    Status New    Target Date 07/16/21      PT LONG TERM GOAL #3   Title Pt will  increase FOTO to at least 75 in order to demonstrate significant improvement in function related to L low back pain.    Baseline 06/04/2021: 67    Time 6    Period Weeks    Status New    Target Date 07/16/21  Plan - 06/25/21 1243     Clinical Impression Statement Pt was moderately stiff due to doing a lot of traveling recently. Began session with lumbar and hip mobilizations to increase ROM. Focused session on alternating between stretching and light strengthening. Added in some bodyweight strengthening. Pt denied any increase in pain with strengthening exercises. HEP to be updated at next session in order to continue progressing strength and introduce more stretching that pt can complete at home. Pt would benefit from continued PT services in order to return to full function at home and work.    Personal Factors and Comorbidities Comorbidity 1    Comorbidities Chronic left-sided lumbar radiculopathy    Examination-Activity Limitations Sit;Stand;Other    Examination-Participation Restrictions Driving;Interpersonal Relationship;Occupation;Other   Traveling   Stability/Clinical Decision Making Evolving/Moderate complexity    Rehab Potential Good    PT Frequency 2x / week    PT Duration 6 weeks    PT Treatment/Interventions Cryotherapy;Electrical Stimulation;Iontophoresis 4mg /ml Dexamethasone;Moist Heat;Traction;Ultrasound;Therapeutic activities;Therapeutic exercise;Neuromuscular re-education;Manual techniques;Dry needling;Spinal Manipulations;Joint Manipulations;Passive range of motion    PT Next Visit Plan Complete mODI, work on gentle strengthening and stretching    PT Home Exercise Plan Access code: [email or web address]    Consulted and Agree with Plan of Care Patient              Patient will benefit from skilled therapeutic intervention in order to improve the following deficits and impairments:  Decreased range of motion, Decreased strength, Pain  Visit  Diagnosis: Radiculopathy, lumbar region  Chronic left-sided low back pain with left-sided sciatica  Muscle weakness (generalized)     Problem List Patient Active Problem List   Diagnosis Date Noted   OSA on CPAP 05/30/2021   Chronic fatigue 05/30/2021   Allergic rhinitis 05/30/2021   Chronic left-sided lumbar radiculopathy 05/30/2021   Encounter for screening examination for sexually transmitted disease 37/90/2409   Biliary colic 73/53/2992   Other constipation 05/05/2019   Bilirubinemia 04/30/2019   Choledocholithiasis 04/30/2019   S/P laparoscopic cholecystectomy 04/30/2019   BMI 45.0-49.9, adult ([place]) 11/04/2018   Need for Tdap vaccination 11/04/2018   Prediabetes 11/04/2018   Adenomatous polyps 06/07/2018   Colon polyp 05/07/2018   Dyslipidemia 05/07/2018   Primary hypertension 05/07/2018   H/O colonoscopy with polypectomy 05/07/2018   Chronic rhinitis 04/13/2011    Iman Reinertsen SPT  Salem Caster. Fairly IV, PT, DPT Physical Therapist- [facility]  06/25/2021, 1:00 PM  [facility] [address] [place], [place], [address] Phone: [phone]   Fax:  [phone]  Name: Travis Petty MRN: [id number] Date of Birth: Jul 04, 1975

## 2021-06-26 ENCOUNTER — Ambulatory Visit: Payer: 59 | Admitting: Family Medicine

## 2021-06-26 ENCOUNTER — Encounter: Payer: Self-pay | Admitting: Family Medicine

## 2021-06-26 DIAGNOSIS — I10 Essential (primary) hypertension: Secondary | ICD-10-CM

## 2021-06-27 MED ORDER — METOPROLOL SUCCINATE ER 25 MG PO TB24: 25.0000 mg | ORAL_TABLET | Freq: Every day | ORAL | 2 refills | Status: DC

## 2021-06-27 MED ORDER — LOSARTAN POTASSIUM-HCTZ 100-25 MG PO TABS: 1.0000 | ORAL_TABLET | Freq: Every day | ORAL | 2 refills | Status: DC

## 2021-06-27 NOTE — Assessment & Plan Note (Signed)
Improved though still elevated, due to intolerance of calcium channel blockers due to bleeding, plan for initiation of metoprolol.  Plan for close follow-up in 4 weeks for reassessment of this chronic condition with ongoing symptomatology.

## 2021-06-27 NOTE — Progress Notes (Signed)
°  ° °  Primary Care / Sports Medicine Office Visit  Patient Information:  Patient ID: Travis Petty, male DOB: 1975/12/08 Age: 46 y.o. MRN: [id number]   Travis Petty is a pleasant 46 y.o. male presenting with the following:  Chief Complaint  Patient presents with   Hypertension   Back Pain    X-Ray 05/30/21    Vitals:   06/26/21 1104  BP: (!) 138/92  Pulse: 66  SpO2: 97%   Vitals:   06/26/21 1104  Weight: (!) 329 lb (149.2 kg)  Height: 6' 1.5" (1.867 m)   Body mass index is 42.82 kg/m.  DG Lumbar Spine Complete  Result Date: 05/31/2021 CLINICAL DATA:  Chronic back pain with left lower extremity paresthesias. EXAM: LUMBAR SPINE - COMPLETE 4+ VIEW COMPARISON:  None. FINDINGS: There is no evidence of lumbar spine fracture. Alignment is normal. The vertebra are normal in heights. There is normal bone mineralization. There is mild lumbar marginal osteophytosis. Mild disc space loss is noted at T12-L1, L1-2 and L5-S1 with normal disc heights from L2-3 through L4-5, and mild facet joint spurring is noted from L2-3 down. On the oblique views there is no appreciable critical foraminal stenosis but MRI would be more sensitive in detecting this. 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 The right SI joint appears patent. Visualized portions of the pelvis are unremarkable. IMPRESSION: Degenerative changes with mild spondylosis and facet joint spurring, and 3 levels show mild disc space loss. No spinal compression fracture. Electronically Signed   By: Telford Nab M.D.   On: 05/31/2021 21:06     Independent interpretation of notes and tests performed by another provider:   None  Procedures performed:   None  Pertinent History, Exam, Impression, and Recommendations:   Primary hypertension Improved though still elevated, due to intolerance of calcium channel blockers due to bleeding, plan for initiation of metoprolol.  Plan for close follow-up in 4 weeks for  reassessment of this chronic condition with ongoing symptomatology.   Orders & Medications Meds ordered this encounter  Medications   losartan-hydrochlorothiazide (HYZAAR) 100-25 MG tablet    Sig: Take 1 tablet by mouth daily.    Dispense:  30 tablet    Refill:  2    Please use GoodRx coupon card.   metoprolol succinate (TOPROL-XL) 25 MG 24 hr tablet    Sig: Take 1 tablet (25 mg total) by mouth daily.    Dispense:  30 tablet    Refill:  2   No orders of the defined types were placed in this encounter.    Return in about 4 weeks (around 07/24/2021) for follow-up.     [name], MD   Primary Care Sports Medicine [place]

## 2021-07-02 ENCOUNTER — Other Ambulatory Visit: Payer: Self-pay

## 2021-07-02 ENCOUNTER — Ambulatory Visit: Payer: 59

## 2021-07-02 DIAGNOSIS — M5416 Radiculopathy, lumbar region: Secondary | ICD-10-CM | POA: Diagnosis not present

## 2021-07-02 DIAGNOSIS — G8929 Other chronic pain: Secondary | ICD-10-CM

## 2021-07-02 NOTE — Therapy (Signed)
[facility] [address] [place], [place], [address] Phone: [phone]   Fax:  [phone]  Physical Therapy Treatment  Patient Details  Name: Travis Petty MRN: [id number] Date of Birth: 1975-10-31 Referring Provider (PT): Dr. Zigmund Daniel   Encounter Date: 07/02/2021   PT End of Session - 07/02/21 1538     Visit Number 4    Number of Visits 13    Date for PT Re-Evaluation 07/16/21    Authorization Type eval: 06/04/2021    PT Start Time 1535    PT Stop Time 1615    PT Time Calculation (min) 40 min    Activity Tolerance Patient tolerated treatment well    Behavior During Therapy [facility] for tasks assessed/performed             Past Medical History:  Diagnosis Date   Allergy    Bronchitis 05/25/2018   Last Assessment & Plan:  Formatting of this note might be different from the original. Reviewed hx, sx, exam with pt Proceed with antibx With recent doxycycline and allergies, will proceed with rx levofloxacin. Also included short burst of po steroid. If symptoms not improved in next 3-5 days with treatment/plan recommended, patient was advised to follow up with office for further recommendations.   Cholecystitis    Deviated septum    GERD (gastroesophageal reflux disease)    Gout    Left Achilles tendinitis 11/04/2018   Last Assessment & Plan:  Formatting of this note might be different from the original. Follow up with Ortho   OSA on CPAP    Skin lesions 06/28/2019   Last Assessment & Plan:  Formatting of this note might be different from the original. This appears benign just monitor.    Past Surgical History:  Procedure Laterality Date   CHOLECYSTECTOMY  2020   WISDOM TOOTH EXTRACTION      There were no vitals filed for this visit.   Subjective Assessment - 07/02/21 1535     Subjective Pt reports no pain upon arrival, but is still having some stiffness in the back and some tingling in the toes. Pt reports he has been  doing his HEP. No specific questions or concerns.    Pertinent History Pt has history of L side lumbar radiculopathy / sciatica for several years that presents as occasional bouts of sharp lower L lumbar pain and an intense burning pain down the posterior aspect of his L thigh. Current bout of pain has been on and off for the past few months. This bout has also presented itself with semi-constant tingling in his 4th phalanx of his L foot. L foot does not have any sensation loss or pain, but pt describes the sensation "like electricity buzzing". Lower back and thigh pain only occurs occasionally and when not acting up pt reports 0/10 pain. For this current episode pt went to his provider and they prescribed him muscle relaxers which has not helped. Pt not a big fan of medicine so only takes the muscle relaxers when the pain becomes unbearable. Motions that cause the L lower back and thigh pain to flare up are: sitting too long in an uncomfortable chair, walking for long periods of time, exercising at the gym, twisting motions, and engaging in sexual activity with his girlfriend. When pain increases pt mainly utilizes stretches to help it subside. Pt has a lot of traveling coming up and does not want to experience a flare up during travel. Pt works for  a [employer] business and is sitting most of the day at work. Pt enjoys being active and exercising.    Limitations Sitting;Standing;Walking    Currently in Pain? No/denies                TREATMENT   Manual Therapy: CPA Lumbar L1-L5 mobilizations grade II-III 30s/bout x 2 bouts UPA L Lumbar L1-5 mobilizations grade II-II 30s/bout x 2 bouts Prone STM to L lumbar paraspinals with effleurage and trigger point release; Supine L HS stretch with DF/PF 2 x 30s; L single leg knee to chest stretch x 30s; Supine LLE long axis distraction with belt 3 x 30s; Supine L hip medial to lateral mobilizations with belt, grade III, 30s/bout x 3  bouts; Hooklying lumbar traction with belt assist (belt behind knees) 3 x 30s;   There Ex: Supine L SLR 2 x 10, cues for lumbar stabilization;   Pt educated throughout session about proper posture and technique with exercises. Improved exercise technique, movement at target joints, use of target muscles after min to mod verbal, visual, tactile cues.   Pt reports significant improvement in his back pain over the course of the last week. However he continues with numbness in his toes. Continued with manual therapy during session and pt reports increase in his L lumbar paraspinal tenderness today. Continued with L hip mobilizations as well as lumbar manual traction. Pt denied any increase in pain with strengthening exercises. Continue HEP and follow-up as scheduled. Pt would benefit from continued PT services in order to return to full function at home and work.     PT Short Term Goals - 06/04/21 1342       PT SHORT TERM GOAL #1   Title Pt will be independent with HEP in order to improve strength and decrease L lumbar back pain in order to improve pain-free function at home and work.    Time 3    Period Weeks    Status New    Target Date 06/25/21               PT Long Term Goals - 07/02/21 1539       PT LONG TERM GOAL #1   Title Pt will increase lumbar flexion by 10 degrees to show a significant increase in range of motion in order to increase ability to perform functional activities at work and home like tieing his shoes.    Baseline 06/04/2021: 35    Time 6    Period Weeks    Status Deferred    Target Date 07/16/21      PT LONG TERM GOAL #2   Title Pt will decrease worst back pain as reported on NPRS by at least 2 points in order to demonstrate clinically significant reduction in his L lower back pain.    Baseline 07/02/21: worst: 1-2/10;    Time 6    Period Weeks    Status New    Target Date 07/16/21      PT LONG TERM GOAL #3   Title Pt will increase FOTO to at least  75 in order to demonstrate significant improvement in function related to L low back pain.    Baseline 06/04/2021: 67; 07/02/21: 65    Time 6    Period Weeks    Status On-going    Target Date 07/16/21                   Plan - 07/02/21 2229  Clinical Impression Statement Pt reports significant improvement in his back pain over the course of the last week. However he continues with numbness in his toes. Continued with manual therapy during session and pt reports increase in his L lumbar paraspinal tenderness today. Continued with L hip mobilizations as well as lumbar manual traction. Pt denied any increase in pain with strengthening exercises. Continue HEP and follow-up as scheduled. Pt would benefit from continued PT services in order to return to full function at home and work.    Personal Factors and Comorbidities Comorbidity 1    Comorbidities Chronic left-sided lumbar radiculopathy    Examination-Activity Limitations Sit;Stand;Other    Examination-Participation Restrictions Driving;Interpersonal Relationship;Occupation;Other   Traveling   Stability/Clinical Decision Making Evolving/Moderate complexity    Rehab Potential Good    PT Frequency 2x / week    PT Duration 6 weeks    PT Treatment/Interventions Cryotherapy;Electrical Stimulation;Iontophoresis 4mg /ml Dexamethasone;Moist Heat;Traction;Ultrasound;Therapeutic activities;Therapeutic exercise;Neuromuscular re-education;Manual techniques;Dry needling;Spinal Manipulations;Joint Manipulations;Passive range of motion    PT Next Visit Plan Complete mODI, work on gentle strengthening and stretching    PT Home Exercise Plan Access code: [email or web address]    Consulted and Agree with Plan of Care Patient               Patient will benefit from skilled therapeutic intervention in order to improve the following deficits and impairments:  Decreased range of motion, Decreased strength, Pain  Visit Diagnosis: Chronic left-sided low back  pain with left-sided sciatica     Problem List Patient Active Problem List   Diagnosis Date Noted   OSA on CPAP 05/30/2021   Chronic fatigue 05/30/2021   Allergic rhinitis 05/30/2021   Chronic left-sided lumbar radiculopathy 05/30/2021   Encounter for screening examination for sexually transmitted disease 83/41/9622   Biliary colic 29/79/8921   Other constipation 05/05/2019   Bilirubinemia 04/30/2019   Choledocholithiasis 04/30/2019   S/P laparoscopic cholecystectomy 04/30/2019   BMI 45.0-49.9, adult ([place]) 11/04/2018   Need for Tdap vaccination 11/04/2018   Prediabetes 11/04/2018   Adenomatous polyps 06/07/2018   Colon polyp 05/07/2018   Dyslipidemia 05/07/2018   Primary hypertension 05/07/2018   H/O colonoscopy with polypectomy 05/07/2018   Chronic rhinitis 04/13/2011    Phillips Grout PT, DPT, GCS  Physical Therapist- [facility]  07/02/2021, 10:42 PM   [facility] [address] [place], [place], [address] Phone: [phone]   Fax:  [phone]  Name: Travis Petty MRN: [id number] Date of Birth: April 06, 1976

## 2021-07-08 NOTE — Therapy (Signed)
[facility] [address] [place], [place], [address] Phone: [phone]   Fax:  [phone]  Physical Therapy Treatment  Patient Details  Name: Travis Petty MRN: [id number] Date of Birth: 08-28-1975 Referring Provider (PT): Dr. Zigmund Daniel   Encounter Date: 07/09/2021   PT End of Session - 07/09/21 1153     Visit Number 5    Number of Visits 13    Date for PT Re-Evaluation 07/16/21    Authorization Type eval: 06/04/2021    PT Start Time 1155    PT Stop Time 1230    PT Time Calculation (min) 35 min    Activity Tolerance Patient tolerated treatment well    Behavior During Therapy [facility] for tasks assessed/performed              Past Medical History:  Diagnosis Date   Allergy    Bronchitis 05/25/2018   Last Assessment & Plan:  Formatting of this note might be different from the original. Reviewed hx, sx, exam with pt Proceed with antibx With recent doxycycline and allergies, will proceed with rx levofloxacin. Also included short burst of po steroid. If symptoms not improved in next 3-5 days with treatment/plan recommended, patient was advised to follow up with office for further recommendations.   Cholecystitis    Deviated septum    GERD (gastroesophageal reflux disease)    Gout    Left Achilles tendinitis 11/04/2018   Last Assessment & Plan:  Formatting of this note might be different from the original. Follow up with Ortho   OSA on CPAP    Skin lesions 06/28/2019   Last Assessment & Plan:  Formatting of this note might be different from the original. This appears benign just monitor.    Past Surgical History:  Procedure Laterality Date   CHOLECYSTECTOMY  2020   WISDOM TOOTH EXTRACTION      There were no vitals filed for this visit.   Subjective Assessment - 07/09/21 1153     Subjective Pt reports worsening low back soreness upon arrival today and rates his current back pain as 3-4/10. Numbness in his L foot has  remained persistant. No specific questions upon arrival.    Pertinent History Pt has history of L side lumbar radiculopathy / sciatica for several years that presents as occasional bouts of sharp lower L lumbar pain and an intense burning pain down the posterior aspect of his L thigh. Current bout of pain has been on and off for the past few months. This bout has also presented itself with semi-constant tingling in his 4th phalanx of his L foot. L foot does not have any sensation loss or pain, but pt describes the sensation "like electricity buzzing". Lower back and thigh pain only occurs occasionally and when not acting up pt reports 0/10 pain. For this current episode pt went to his provider and they prescribed him muscle relaxers which has not helped. Pt not a big fan of medicine so only takes the muscle relaxers when the pain becomes unbearable. Motions that cause the L lower back and thigh pain to flare up are: sitting too long in an uncomfortable chair, walking for long periods of time, exercising at the gym, twisting motions, and engaging in sexual activity with his girlfriend. When pain increases pt mainly utilizes stretches to help it subside. Pt has a lot of traveling coming up and does not want to experience a flare up during travel. Pt works for a [employer]  and is sitting most of the day at work. Pt enjoys being active and exercising.    Limitations Sitting;Standing;Walking                 TREATMENT   Manual Therapy: Prone STM to L bilateral lumbar paraspinals with effleurage; Prone L external rotator stretch 2 x 30s;   There Ex: Prone hamstring curls with manual resistance from therapist 2 x 10 BLE; Prone straight knee hip extension 2 x 10 BLE;   Trigger Point Dry Needling (TDN) Estim Education performed with patient regarding potential benefit of TDN. Reviewed precautions and risks with patient. Extensive time spent with pt to ensure full understanding of  TDN risks. Pt provided verbal consent to treatment. Using clean technique with pt in prone TDN performed to bilateral lumbar multifidi at L4/5 with 2, 0.35 x 75 single needle placements (one on each side). After initial placement attached E-Stim II unit at '2Hz'$  frequency and 2.56m x 10 minutes;   Pt educated throughout session about proper posture and technique with exercises. Improved exercise technique, movement at target joints, use of target muscles after min to mod verbal, visual, tactile cues.   Pt arrived late so therapy session was abbreviated. Introduced trigger point dry needling electrical stimulation and avoided lumbar mobilizations as pt reports worsening soreness after last session. Continued with extension based strengthening. Pt reports significant improvement in his back pain at the end of the session. Pt encouraged to continue HEP and follow-up as scheduled. Pt would benefit from continued PT services in order to return to full function at home and work.     PT Short Term Goals - 06/04/21 1342       PT SHORT TERM GOAL #1   Title Pt will be independent with HEP in order to improve strength and decrease L lumbar back pain in order to improve pain-free function at home and work.    Time 3    Period Weeks    Status New    Target Date 06/25/21               PT Long Term Goals - 07/02/21 1539       PT LONG TERM GOAL #1   Title Pt will increase lumbar flexion by 10 degrees to show a significant increase in range of motion in order to increase ability to perform functional activities at work and home like tieing his shoes.    Baseline 06/04/2021: 35    Time 6    Period Weeks    Status Deferred    Target Date 07/16/21      PT LONG TERM GOAL #2   Title Pt will decrease worst back pain as reported on NPRS by at least 2 points in order to demonstrate clinically significant reduction in his L lower back pain.    Baseline 07/02/21: worst: 1-2/10;    Time 6    Period Weeks     Status New    Target Date 07/16/21      PT LONG TERM GOAL #3   Title Pt will increase FOTO to at least 75 in order to demonstrate significant improvement in function related to L low back pain.    Baseline 06/04/2021: 67; 07/02/21: 65    Time 6    Period Weeks    Status On-going    Target Date 07/16/21                   Plan - 07/09/21 1153  Clinical Impression Statement Pt arrived late so therapy session was abbreviated. Introduced trigger point dry needling electrical stimulation and avoided lumbar mobilizations as pt reports worsening soreness after last session. Continued with extension based strengthening. Pt reports significant improvement in his back pain at the end of the session. Pt encouraged to continue HEP and follow-up as scheduled. Pt would benefit from continued PT services in order to return to full function at home and work.    Personal Factors and Comorbidities Comorbidity 1    Comorbidities Chronic left-sided lumbar radiculopathy    Examination-Activity Limitations Sit;Stand;Other    Examination-Participation Restrictions Driving;Interpersonal Relationship;Occupation;Other   Traveling   Stability/Clinical Decision Making Evolving/Moderate complexity    Rehab Potential Good    PT Frequency 2x / week    PT Duration 6 weeks    PT Treatment/Interventions Cryotherapy;Electrical Stimulation;Iontophoresis '4mg'$ /ml Dexamethasone;Moist Heat;Traction;Ultrasound;Therapeutic activities;Therapeutic exercise;Neuromuscular re-education;Manual techniques;Dry needling;Spinal Manipulations;Joint Manipulations;Passive range of motion    PT Next Visit Plan Work on gentle strengthening and stretching    PT Home Exercise Plan Access code: [email or web address]    Consulted and Agree with Plan of Care Patient                Patient will benefit from skilled therapeutic intervention in order to improve the following deficits and impairments:  Decreased range of motion, Decreased  strength, Pain  Visit Diagnosis: Chronic left-sided low back pain with left-sided sciatica  Muscle weakness (generalized)     Problem List Patient Active Problem List   Diagnosis Date Noted   OSA on CPAP 05/30/2021   Chronic fatigue 05/30/2021   Allergic rhinitis 05/30/2021   Chronic left-sided lumbar radiculopathy 05/30/2021   Encounter for screening examination for sexually transmitted disease 62/70/3500   Biliary colic 93/81/8299   Other constipation 05/05/2019   Bilirubinemia 04/30/2019   Choledocholithiasis 04/30/2019   S/P laparoscopic cholecystectomy 04/30/2019   BMI 45.0-49.9, adult ([place]) 11/04/2018   Need for Tdap vaccination 11/04/2018   Prediabetes 11/04/2018   Adenomatous polyps 06/07/2018   Colon polyp 05/07/2018   Dyslipidemia 05/07/2018   Primary hypertension 05/07/2018   H/O colonoscopy with polypectomy 05/07/2018   Chronic rhinitis 04/13/2011    Phillips Grout PT, DPT, GCS  Physical Therapist- [facility]  07/09/2021, 4:04 PM  [facility] [address] [place], [place], [address] Phone: [phone]   Fax:  [phone]  Name: Travis Petty MRN: [id number] Date of Birth: Feb 03, 1976

## 2021-07-09 ENCOUNTER — Other Ambulatory Visit: Payer: Self-pay

## 2021-07-09 ENCOUNTER — Ambulatory Visit: Payer: 59 | Attending: Family Medicine

## 2021-07-09 DIAGNOSIS — M6281 Muscle weakness (generalized): Secondary | ICD-10-CM | POA: Insufficient documentation

## 2021-07-09 DIAGNOSIS — G8929 Other chronic pain: Secondary | ICD-10-CM | POA: Diagnosis present

## 2021-07-09 DIAGNOSIS — M5442 Lumbago with sciatica, left side: Secondary | ICD-10-CM | POA: Diagnosis not present

## 2021-07-09 DIAGNOSIS — M5416 Radiculopathy, lumbar region: Secondary | ICD-10-CM | POA: Diagnosis present

## 2021-07-15 NOTE — Therapy (Incomplete)
[facility] [address] [place], [place], [address] Phone: [phone]   Fax:  [phone]  Physical Therapy Treatment  Patient Details  Name: Travis Petty MRN: [id number] Date of Birth: [birth year] Referring Provider (PT): Dr. Zigmund Daniel   Encounter Date: 07/16/2021      Past Medical History:  Diagnosis Date   Allergy    Bronchitis 05/25/2018   Last Assessment & Plan:  Formatting of this note might be different from the original. Reviewed hx, sx, exam with pt Proceed with antibx With recent doxycycline and allergies, will proceed with rx levofloxacin. Also included short burst of po steroid. If symptoms not improved in next 3-5 days with treatment/plan recommended, patient was advised to follow up with office for further recommendations.   Cholecystitis    Deviated septum    GERD (gastroesophageal reflux disease)    Gout    Left Achilles tendinitis 11/04/2018   Last Assessment & Plan:  Formatting of this note might be different from the original. Follow up with Ortho   OSA on CPAP    Skin lesions 06/28/2019   Last Assessment & Plan:  Formatting of this note might be different from the original. This appears benign just monitor.    Past Surgical History:  Procedure Laterality Date   CHOLECYSTECTOMY  2020   WISDOM TOOTH EXTRACTION      There were no vitals filed for this visit.         TREATMENT   Manual Therapy: Prone STM to L bilateral lumbar paraspinals with effleurage; Prone L external rotator stretch 2 x 30s;   There Ex: Prone hamstring curls with manual resistance from therapist 2 x 10 BLE; Prone straight knee hip extension 2 x 10 BLE;   Trigger Point Dry Needling (TDN) Estim Education performed with patient regarding potential benefit of TDN. Reviewed precautions and risks with patient. Extensive time spent with pt to ensure full understanding of TDN risks. Pt provided verbal consent to treatment.  Using clean technique with pt in prone TDN performed to bilateral lumbar multifidi at L4/5 with 2, 0.35 x 75 single needle placements (one on each side). After initial placement attached E-Stim II unit at '2Hz'$  frequency and 2.73m x 10 minutes;   Pt educated throughout session about proper posture and technique with exercises. Improved exercise technique, movement at target joints, use of target muscles after min to mod verbal, visual, tactile cues.   Pt arrived late so therapy session was abbreviated. Introduced trigger point dry needling electrical stimulation and avoided lumbar mobilizations as pt reports worsening soreness after last session. Continued with extension based strengthening. Pt reports significant improvement in his back pain at the end of the session. Pt encouraged to continue HEP and follow-up as scheduled. Pt would benefit from continued PT services in order to return to full function at home and work.     PT Short Term Goals - 06/04/21 1342       PT SHORT TERM GOAL #1   Title Pt will be independent with HEP in order to improve strength and decrease L lumbar back pain in order to improve pain-free function at home and work.    Time 3    Period Weeks    Status New    Target Date 06/25/21               PT Long Term Goals - 07/02/21 1539       PT LONG TERM GOAL #1  Title Pt will increase lumbar flexion by 10 degrees to show a significant increase in range of motion in order to increase ability to perform functional activities at work and home like tieing his shoes.    Baseline 06/04/2021: 35    Time 6    Period Weeks    Status Deferred    Target Date 07/16/21      PT LONG TERM GOAL #2   Title Pt will decrease worst back pain as reported on NPRS by at least 2 points in order to demonstrate clinically significant reduction in his L lower back pain.    Baseline 07/02/21: worst: 1-2/10;    Time 6    Period Weeks    Status New    Target Date 07/16/21      PT  LONG TERM GOAL #3   Title Pt will increase FOTO to at least 75 in order to demonstrate significant improvement in function related to L low back pain.    Baseline 06/04/2021: 67; 07/02/21: 65    Time 6    Period Weeks    Status On-going    Target Date 07/16/21                        Patient will benefit from skilled therapeutic intervention in order to improve the following deficits and impairments:     Visit Diagnosis: Chronic left-sided low back pain with left-sided sciatica  Muscle weakness (generalized)  Radiculopathy, lumbar region     Problem List Patient Active Problem List   Diagnosis Date Noted   OSA on CPAP 05/30/2021   Chronic fatigue 05/30/2021   Allergic rhinitis 05/30/2021   Chronic left-sided lumbar radiculopathy 05/30/2021   Encounter for screening examination for sexually transmitted disease 16/02/9603   Biliary colic 54/01/8118   Other constipation 05/05/2019   Bilirubinemia 04/30/2019   Choledocholithiasis 04/30/2019   S/P laparoscopic cholecystectomy 04/30/2019   BMI 45.0-49.9, adult ([place]) 11/04/2018   Need for Tdap vaccination 11/04/2018   Prediabetes 11/04/2018   Adenomatous polyps 06/07/2018   Colon polyp 05/07/2018   Dyslipidemia 05/07/2018   Primary hypertension 05/07/2018   H/O colonoscopy with polypectomy 05/07/2018   Chronic rhinitis 04/13/2011    Phillips Grout PT, DPT, GCS  Physical Therapist- [facility]  07/15/2021, 10:26 PM  [place] [facility] [address] [place], [place], [address] Phone: [phone]   Fax:  [phone]  Name: Travis Petty MRN: [id number] Date of Birth: August 26, 1975

## 2021-07-16 ENCOUNTER — Ambulatory Visit: Payer: 59

## 2021-07-16 ENCOUNTER — Other Ambulatory Visit: Payer: Self-pay

## 2021-07-16 DIAGNOSIS — M5416 Radiculopathy, lumbar region: Secondary | ICD-10-CM

## 2021-07-16 DIAGNOSIS — M5442 Lumbago with sciatica, left side: Secondary | ICD-10-CM | POA: Diagnosis not present

## 2021-07-16 DIAGNOSIS — G8929 Other chronic pain: Secondary | ICD-10-CM

## 2021-07-16 DIAGNOSIS — M6281 Muscle weakness (generalized): Secondary | ICD-10-CM

## 2021-07-23 ENCOUNTER — Other Ambulatory Visit: Payer: Self-pay

## 2021-07-23 ENCOUNTER — Ambulatory Visit: Payer: 59

## 2021-07-23 DIAGNOSIS — G8929 Other chronic pain: Secondary | ICD-10-CM

## 2021-07-23 DIAGNOSIS — M5442 Lumbago with sciatica, left side: Secondary | ICD-10-CM | POA: Diagnosis not present

## 2021-07-23 DIAGNOSIS — M6281 Muscle weakness (generalized): Secondary | ICD-10-CM

## 2021-07-23 NOTE — Therapy (Signed)
[place] [facility] ?[address] [place], [place], [address] ?Phone: [phone]   Fax:  [phone] ? ?Physical Therapy Treatment/Discharge ? ?Patient Details  ?Name: Travis Petty ?MRN: [id number] ?Date of Birth: 05-08-1975 ?Referring Provider (PT): Dr. Zigmund Daniel ? ? ?Encounter Date: 07/23/2021 ? ? PT End of Session - 07/23/21 1318   ? ? Visit Number 7   ? Number of Visits 14   ? Date for PT Re-Evaluation 07/30/21   ? Authorization Type eval: 06/04/2021   ? PT Start Time 1315   ? PT Stop Time 1400   ? PT Time Calculation (min) 45 min   ? Activity Tolerance Patient tolerated treatment well   ? Behavior During Therapy [facility] for tasks assessed/performed   ? ?  ?  ? ?  ? ? ? ? ? ?Past Medical History:  ?Diagnosis Date  ? Allergy   ? Bronchitis 05/25/2018  ? Last Assessment & Plan:  Formatting of this note might be different from the original. Reviewed hx, sx, exam with pt Proceed with antibx With recent doxycycline and allergies, will proceed with rx levofloxacin. Also included short burst of po steroid. If symptoms not improved in next 3-5 days with treatment/plan recommended, patient was advised to follow up with office for further recommendations.  ? Cholecystitis   ? Deviated septum   ? GERD (gastroesophageal reflux disease)   ? Gout   ? Left Achilles tendinitis 11/04/2018  ? Last Assessment & Plan:  Formatting of this note might be different from the original. Follow up with Ortho  ? OSA on CPAP   ? Skin lesions 06/28/2019  ? Last Assessment & Plan:  Formatting of this note might be different from the original. This appears benign just monitor.  ? ? ?Past Surgical History:  ?Procedure Laterality Date  ? CHOLECYSTECTOMY  2020  ? WISDOM TOOTH EXTRACTION    ? ? ?There were no vitals filed for this visit. ? ? Subjective Assessment - 07/23/21 1317   ? ? Subjective Pt reports his back pain has continued to improve. Overall he reports approximately 30% improvement in his back pain. He  no longer has the constant tightness in his back. He continues to have numbness in his L foot. He returns to see Dr. Zigmund Daniel tomorrow and believes he is ready to discharge from therapy today. No specific questions upon arrival.   ? Pertinent History Pt has history of L side lumbar radiculopathy / sciatica for several years that presents as occasional bouts of sharp lower L lumbar pain and an intense burning pain down the posterior aspect of his L thigh. Current bout of pain has been on and off for the past few months. This bout has also presented itself with semi-constant tingling in his 4th phalanx of his L foot. L foot does not have any sensation loss or pain, but pt describes the sensation "like electricity buzzing". Lower back and thigh pain only occurs occasionally and when not acting up pt reports 0/10 pain. For this current episode pt went to his provider and they prescribed him muscle relaxers which has not helped. Pt not a big fan of medicine so only takes the muscle relaxers when the pain becomes unbearable. Motions that cause the L lower back and thigh pain to flare up are: sitting too long in an uncomfortable chair, walking for long periods of time, exercising at the gym, twisting motions, and engaging in sexual activity with his girlfriend. When pain increases pt  mainly utilizes stretches to help it subside. Pt has a lot of traveling coming up and does not want to experience a flare up during travel. Pt works for a [employer] and is sitting most of the day at work. Pt enjoys being active and exercising.   ? Limitations Sitting;Standing;Walking   ? Currently in Pain? No/denies   ? ?  ?  ? ?  ? ? ? ? ? ? ? ? ?TREATMENT ? ? ?Manual Therapy: ?Updated outcome measures/goals; ?Prone CPA L3-5, grade I-II, 30s/bout x 2 bouts/level; ?Prone bilateral UPA L3-L5, grade I-II, 30s/bout x 2 bouts/level; ?Supine L hamstring stretch with active DF/PF for sciatic nerve mobility 2 x 30s; ? ? ?There  Ex: ?Prone hamstring curls with manual resistance from therapist 2 x 10 BLE; ?Prone straight knee hip extension 2 x 10 BLE; ?HEP printed and reviewed with patient; ? ? ?Trigger Point Dry Needling (TDN) Estim ?Education previously performed with patient regarding potential benefit of TDN. Previously reviewed precautions and risks with patient. Extensive time spent with pt to ensure full understanding of TDN risks. Pt provided verbal consent to treatment. Using clean technique with pt in prone TDN performed to bilateral lumbar multifidi at L4/5 with 4, 0.30 x 75 single needle placements (two on each side). After initial placement attached E-Stim II unit with two parallel channels at _0  frequency and 2.35m (pt tolerated intensity) x 10 minutes; ? ? ?Pt educated throughout session about proper posture and technique with exercises. Improved exercise technique, movement at target joints, use of target muscles after min to mod verbal, visual, tactile cues. ? ? ?Updated outcome measures and goals with pt during session today. Overall he reports at least 30% improvement in his symptoms since starting therapy. He denies any further pain or tightness in his low back and has been able to travel without pain. Unfortunately he has continued to experience numbness in his L toes. His FOTO score increased from 67 at initial evaluation to 70 today. Continued trigger point dry needling electrical stimulation during session as he reports it has been very effective during previous sessions. Continued with extension based strengthening and low grade lumbar mobilizations. He remains tender to central and unilateral passive accessory mobilizations at lower lumbar spine (L4-L5). Pt will be discharged on this date with his pain being fully resolved during functional activities and traveling. He has a follow-up appointment scheduled with Dr. MZigmund Danieltomorrow.  ? ? ? ? PT Short Term Goals - 07/23/21 1319   ? ?  ? PT SHORT TERM GOAL #1  ?  Title Pt will be independent with HEP in order to improve strength and decrease L lumbar back pain in order to improve pain-free function at home and work.   ? Time 3   ? Period Weeks   ? Status Achieved   ? ?  ?  ? ?  ? ? ? ? PT Long Term Goals - 07/23/21 1319   ? ?  ? PT LONG TERM GOAL #1  ? Title Pt will increase lumbar flexion by 10 degrees to show a significant increase in range of motion in order to increase ability to perform functional activities at work and home like tieing his shoes.   ? Baseline 06/04/2021: 35; 07/23/21: 35 degrees;   ? Time 6   ? Period Weeks   ? Status Not Met   ? Target Date --   ?  ? PT LONG TERM GOAL #2  ? Title Pt will decrease worst  back pain as reported on NPRS by at least 2 points in order to demonstrate clinically significant reduction in his L lower back pain.   ? Baseline 07/02/21: worst: 1-2/10; 07/23/21: worst: 0/10;   ? Time 6   ? Period Weeks   ? Status Achieved   ? Target Date --   ?  ? PT LONG TERM GOAL #3  ? Title Pt will increase FOTO to at least 75 in order to demonstrate significant improvement in function related to L low back pain.   ? Baseline 06/04/2021: 67; 07/02/21: 65; 07/23/21: 70   ? Time 6   ? Period Weeks   ? Status Partially Met   ? ?  ?  ? ?  ? ? ? ? ? ? ? ? Plan - 07/23/21 1318   ? ? Clinical Impression Statement Updated outcome measures and goals with pt during session today. Overall he reports at least 30% improvement in his symptoms since starting therapy. He denies any further pain or tightness in his low back and has been able to travel without pain. Unfortunately he has continued to experience numbness in his L toes. His FOTO score increased from 67 at initial evaluation to 70 today. Continued trigger point dry needling electrical stimulation during session as he reports it has been very effective during previous sessions. Continued with extension based strengthening and low grade lumbar mobilizations. He remains tender to central and unilateral passive  accessory mobilizations at lower lumbar spine (L4-L5). Pt will be discharged on this date with his pain being fully resolved during functional activities and traveling. He has a follow-up appointment schedul

## 2021-07-24 ENCOUNTER — Ambulatory Visit: Payer: 59 | Admitting: Family Medicine

## 2021-07-24 ENCOUNTER — Encounter: Payer: Self-pay | Admitting: Family Medicine

## 2021-07-24 VITALS — BP 138/84 | HR 68 | Resp 98 | Ht 73.25 in | Wt 321.0 lb

## 2021-07-24 DIAGNOSIS — Z9989 Dependence on other enabling machines and devices: Secondary | ICD-10-CM

## 2021-07-24 DIAGNOSIS — G4733 Obstructive sleep apnea (adult) (pediatric): Secondary | ICD-10-CM

## 2021-07-24 DIAGNOSIS — I10 Essential (primary) hypertension: Secondary | ICD-10-CM

## 2021-07-24 DIAGNOSIS — M5416 Radiculopathy, lumbar region: Secondary | ICD-10-CM | POA: Diagnosis not present

## 2021-07-24 DIAGNOSIS — E118 Type 2 diabetes mellitus with unspecified complications: Secondary | ICD-10-CM | POA: Diagnosis not present

## 2021-07-24 MED ORDER — OZEMPIC (0.25 OR 0.5 MG/DOSE) 2 MG/1.5ML ~~LOC~~ SOPN: 0.5000 mg | PEN_INJECTOR | SUBCUTANEOUS | 0 refills | Status: DC

## 2021-07-24 NOTE — Assessment & Plan Note (Addendum)
Chronic condition, stable, he has tolerated new modification of medications per last visit, will remain on current regimen.  He is working on lifestyle and exercise modifications. ? ?Chronic condition, stable ?

## 2021-07-24 NOTE — Assessment & Plan Note (Signed)
Patient has been working on lifestyle modifications from dietary and exercise standpoint, has noted associated weight reduction.  Is interested in further control given his comorbid BMI of 42.06.  Plan for Ozempic initial dose, titration in 1 month if tolerating well, and follow-up in 2 months to assess response.  We will obtain labs at that time. ? ?Chronic condition, stable, Rx management ?

## 2021-07-24 NOTE — Assessment & Plan Note (Signed)
Did finish physical therapy, noted improvement following this, continues home exercises, and has had improvement following weight loss.  I have encouraged him to continue with the rehab on a maintenance basis and follow-up as needed for this.  Still having some minor left fifth toe paresthesias that we will monitor. ?

## 2021-07-24 NOTE — Progress Notes (Signed)
?  ? ?  Primary Care / Sports Medicine Office Visit ? ?Patient Information:  ?Patient ID: Travis Petty, male DOB: [birth year] Age: 46 y.o. MRN: [id number]  ? ?Travis Petty is a pleasant 46 y.o. male presenting with the following: ? ?Chief Complaint  ?Patient presents with  ? Hypertension  ?  Pt here for F/U, doesn't monitor at home, does take medication daily.   ? Diabetes  ?  Monitoring at home ranging low 100. Would like to try [facility]   ? Insomnia  ?  Has been using sleep machine and has been getting good sleep, staying awake through out day  ? ? ?Vitals:  ? 07/24/21 1025  ?BP: 138/84  ?Pulse: 68  ?Resp: (!) 98  ? ?Vitals:  ? 07/24/21 1025  ?Weight: (!) 321 lb (145.6 kg)  ?Height: 6' 1.25" (1.861 m)  ? ?Body mass index is 42.06 kg/m?. ? ?No results found.  ? ?Independent interpretation of notes and tests performed by another provider:  ? ?None ? ?Procedures performed:  ? ?None ? ?Pertinent History, Exam, Impression, and Recommendations:  ? ?Primary hypertension ?Chronic condition, stable, he has tolerated new modification of medications per last visit, will remain on current regimen.  He is working on lifestyle and exercise modifications. ? ?Chronic condition, stable ? ?OSA on CPAP ?Has noted excellent improvement in his sleep quality and reduction insomnia following use of CPAP, did touch base with ENT for further optimization of his congestion as well. ? ?Type 2 diabetes mellitus with complication ([place]) ?Patient has been working on lifestyle modifications from dietary and exercise standpoint, has noted associated weight reduction.  Is interested in further control given his comorbid BMI of 42.06.  Plan for Ozempic initial dose, titration in 1 month if tolerating well, and follow-up in 2 months to assess response.  We will obtain labs at that time. ? ?Chronic condition, stable, Rx management ? ?Chronic left-sided lumbar radiculopathy ?Did finish physical therapy, noted improvement following this, continues  home exercises, and has had improvement following weight loss.  I have encouraged him to continue with the rehab on a maintenance basis and follow-up as needed for this.  Still having some minor left fifth toe paresthesias that we will monitor.  ? ?Orders & Medications ?Meds ordered this encounter  ?Medications  ? Semaglutide,0.25 or 0.'5MG'$ /DOS, (OZEMPIC, 0.25 OR 0.5 MG/DOSE,) 2 MG/1.5ML SOPN  ?  Sig: Inject 0.5 mg into the skin once a week for 4 doses.  ?  Dispense:  1.5 mL  ?  Refill:  0  ? ?No orders of the defined types were placed in this encounter. ?  ? ?Return in about 2 months (around 09/23/2021) for Annual Physical.  ?  ? ?[name], MD ? ? Primary Care Sports Medicine ?[facility] ?[place]  ? ?

## 2021-07-24 NOTE — Assessment & Plan Note (Signed)
Has noted excellent improvement in his sleep quality and reduction insomnia following use of CPAP, did touch base with ENT for further optimization of his congestion as well. ?

## 2021-07-24 NOTE — Patient Instructions (Signed)
-   Continue with current blood pressure medication regimen ?- Continue with current metformin ?- Start new injectable medication Ozempic (semaglutide) once weekly as discussed, can use 1 month starter pack provided today ?- We will send months to Rx to your pharmacy ?- Continue the excellent healthy lifestyle changes that you have been doing ?- Return for annual physical in 2 months (can cancel currently schedule appointment) ?- Reach out for any questions between now and then ?

## 2021-08-19 ENCOUNTER — Encounter: Payer: Self-pay | Admitting: Family Medicine

## 2021-08-28 ENCOUNTER — Encounter: Payer: 59 | Admitting: Family Medicine

## 2021-08-29 ENCOUNTER — Other Ambulatory Visit: Payer: Self-pay | Admitting: Family Medicine

## 2021-08-29 DIAGNOSIS — E118 Type 2 diabetes mellitus with unspecified complications: Secondary | ICD-10-CM

## 2021-08-29 NOTE — Telephone Encounter (Signed)
Requested Prescriptions  ?Pending Prescriptions Disp Refills  ?? metFORMIN (GLUCOPHAGE-XR) 500 MG 24 hr tablet [Pharmacy Med Name: METFORMIN HCL ER 500 MG TABLET] 90 tablet 1  ?  Sig: TAKE 1 TABLET (500 MG TOTAL) BY MOUTH DAILY WITH BREAKFAST FOR 90 DOSES.  ?  ? Endocrinology:  Diabetes - Biguanides Passed - 08/29/2021  3:12 AM  ?  ?  Passed - Cr in normal range and within 360 days  ?  Creatinine, Ser  ?Date Value Ref Range Status  ?05/30/2021 1.07 0.76 - 1.27 mg/dL Final  ?   ?  ?  Passed - HBA1C is between 0 and 7.9 and within 180 days  ?  Hgb A1c MFr Bld  ?Date Value Ref Range Status  ?05/30/2021 6.8 (H) 4.8 - 5.6 % Final  ?  Comment:  ?           Prediabetes: 5.7 - 6.4 ?         Diabetes: >6.4 ?         Glycemic control for adults with diabetes: <7.0 ?  ?   ?  ?  Passed - eGFR in normal range and within 360 days  ?  eGFR  ?Date Value Ref Range Status  ?05/30/2021 87 >59 mL/min/1.73 Final  ?   ?  ?  Passed - B12 Level in normal range and within 720 days  ?  Vitamin B-12  ?Date Value Ref Range Status  ?05/30/2021 462 232 - 1,245 pg/mL Final  ?   ?  ?  Passed - Valid encounter within last 6 months  ?  Recent Outpatient Visits   ?      ? 1 month ago Type 2 diabetes mellitus with complication ([place])  ? [facility] [name], MD  ? 2 months ago Primary hypertension  ? [facility] [name], MD  ? 3 months ago Primary hypertension  ? [facility] [name], MD  ?  ?  ?Future Appointments   ?        ? In 3 weeks Zigmund Daniel Earley Abide, MD [facility]  ?  ? ?  ?  ?  Passed - CBC within normal limits and completed in the last 12 months  ?  WBC  ?Date Value Ref Range Status  ?05/30/2021 5.5 3.4 - 10.8 x10E3/uL Final  ? ?RBC  ?Date Value Ref Range Status  ?05/30/2021 5.34 4.14 - 5.80 x10E6/uL Final  ? ?Hemoglobin  ?Date Value Ref Range Status  ?05/30/2021 15.3 13.0 - 17.7 g/dL Final  ? ?Hematocrit  ?Date Value Ref Range Status  ?05/30/2021 43.4 37.5 - 51.0 % Final   ? ?MCHC  ?Date Value Ref Range Status  ?05/30/2021 35.3 31.5 - 35.7 g/dL Final  ? ?MCH  ?Date Value Ref Range Status  ?05/30/2021 28.7 26.6 - 33.0 pg Final  ? ?MCV  ?Date Value Ref Range Status  ?05/30/2021 81 79 - 97 fL Final  ? ?No results found for: PLTCOUNTKUC, LABPLAT, [place] ?RDW  ?Date Value Ref Range Status  ?05/30/2021 13.4 11.6 - 15.4 % Final  ? ?  ?  ?  ? ?

## 2021-09-03 ENCOUNTER — Other Ambulatory Visit: Payer: Self-pay

## 2021-09-03 DIAGNOSIS — E118 Type 2 diabetes mellitus with unspecified complications: Secondary | ICD-10-CM

## 2021-09-03 MED ORDER — OZEMPIC (0.25 OR 0.5 MG/DOSE) 2 MG/1.5ML ~~LOC~~ SOPN: 0.5000 mg | PEN_INJECTOR | SUBCUTANEOUS | 0 refills | Status: AC

## 2021-09-25 ENCOUNTER — Ambulatory Visit (INDEPENDENT_AMBULATORY_CARE_PROVIDER_SITE_OTHER): Payer: 59 | Admitting: Family Medicine

## 2021-09-25 ENCOUNTER — Encounter: Payer: Self-pay | Admitting: Family Medicine

## 2021-09-25 ENCOUNTER — Other Ambulatory Visit: Payer: Self-pay | Admitting: Family Medicine

## 2021-09-25 VITALS — BP 132/86 | HR 71 | Ht 73.25 in | Wt 312.4 lb

## 2021-09-25 DIAGNOSIS — Z0000 Encounter for general adult medical examination without abnormal findings: Secondary | ICD-10-CM | POA: Insufficient documentation

## 2021-09-25 DIAGNOSIS — E118 Type 2 diabetes mellitus with unspecified complications: Secondary | ICD-10-CM

## 2021-09-25 DIAGNOSIS — M5416 Radiculopathy, lumbar region: Secondary | ICD-10-CM

## 2021-09-25 DIAGNOSIS — J310 Chronic rhinitis: Secondary | ICD-10-CM

## 2021-09-25 DIAGNOSIS — I10 Essential (primary) hypertension: Secondary | ICD-10-CM

## 2021-09-25 DIAGNOSIS — K635 Polyp of colon: Secondary | ICD-10-CM

## 2021-09-25 DIAGNOSIS — L700 Acne vulgaris: Secondary | ICD-10-CM | POA: Insufficient documentation

## 2021-09-25 MED ORDER — METOPROLOL SUCCINATE ER 25 MG PO TB24: 25.0000 mg | ORAL_TABLET | Freq: Every day | ORAL | 2 refills | Status: DC

## 2021-09-25 MED ORDER — LOSARTAN POTASSIUM-HCTZ 100-25 MG PO TABS: 1.0000 | ORAL_TABLET | Freq: Every day | ORAL | 2 refills | Status: DC

## 2021-09-25 MED ORDER — SEMAGLUTIDE (1 MG/DOSE) 4 MG/3ML ~~LOC~~ SOPN: 1.0000 mg | PEN_INJECTOR | SUBCUTANEOUS | 0 refills | Status: DC

## 2021-09-25 NOTE — Assessment & Plan Note (Signed)
Controlled with OTC allergy medications

## 2021-09-25 NOTE — Assessment & Plan Note (Signed)
Referral to dermatology placed, acute on chronic issue.

## 2021-09-25 NOTE — Assessment & Plan Note (Addendum)
Tolerating metformin and Ozempic regimen well, aggressively pursuing healthy lifestyle changes, recheck A1c and renal labs ordered today.  We will further titrate Ozempic as he is tolerating current dose well.  Referral to podiatry placed, he has seen eye doctor in the past year, will follow for diabetic eye exam per routine.

## 2021-09-25 NOTE — Assessment & Plan Note (Signed)
Annual examination completed, risk stratification labs ordered, anticipatory guidance provided.  We will follow labs once resulted. 

## 2021-09-25 NOTE — Patient Instructions (Signed)
-   Obtain fasting labs with orders provided (can have water or black coffee but otherwise no food or drink x 8 hours before labs) - Review information provided - Attend eye doctor annually, dentist every 6 months, work towards or maintain 30 minutes of moderate intensity physical activity at least 5 days per week, and consume a balanced diet - Return in 3 months - Contact us for any questions between now and then 

## 2021-09-25 NOTE — Assessment & Plan Note (Signed)
Chronic, has improved following continued weight loss, has not resolved.

## 2021-09-25 NOTE — Assessment & Plan Note (Signed)
Chronic, controlled on current BP regimen, did not dose this a.m., despite this his BP in reasonable range.

## 2021-09-25 NOTE — Progress Notes (Signed)
Annual Physical Exam Visit  Patient Information:  Patient ID: Travis Petty, male DOB: 06-Jul-1975 Age: 46 y.o. MRN: [id number]   Subjective:   CC: Annual Physical Exam  HPI:  Travis Petty is here for their annual physical.  I reviewed the past medical history, family history, social history, surgical history, and allergies today and changes were made as necessary.  Please see the problem list section below for additional details.  Past Medical History: Past Medical History:  Diagnosis Date   Allergy    Bronchitis 05/25/2018   Last Assessment & Plan:  Formatting of this note might be different from the original. Reviewed hx, sx, exam with pt Proceed with antibx With recent doxycycline and allergies, will proceed with rx levofloxacin. Also included short burst of po steroid. If symptoms not improved in next 3-5 days with treatment/plan recommended, patient was advised to follow up with office for further recommendations.   Cholecystitis    Deviated septum    Diabetes mellitus without complication (HCC)    GERD (gastroesophageal reflux disease)    Gout    Hypertension    Left Achilles tendinitis 11/04/2018   Last Assessment & Plan:  Formatting of this note might be different from the original. Follow up with Ortho   OSA on CPAP    Skin lesions 06/28/2019   Last Assessment & Plan:  Formatting of this note might be different from the original. This appears benign just monitor.   Past Surgical History: Past Surgical History:  Procedure Laterality Date   CHOLECYSTECTOMY  2020   WISDOM TOOTH EXTRACTION     Family History: Family History  Problem Relation Age of Onset   Hypertension Mother    Allergies Mother    Thyroid disease Mother    Hypertension Father    Dementia Father    Prostate cancer Paternal Uncle    Hypertension Maternal Grandmother    Hypertension Maternal Grandfather    Hypertension Paternal Grandmother    Colon cancer Paternal Grandmother     Hypertension Paternal Grandfather    Allergies: Allergies  Allergen Reactions   Amoxil [Amoxicillin] Hives and Itching   Penicillins Hives and Itching   Soap Hives    Ivory Soap   Calcium Channel Blockers Other (See Comments)    bleeding   Health Maintenance: Health Maintenance  Topic Date Due   FOOT EXAM  Never done   OPHTHALMOLOGY EXAM  Never done   HEMOGLOBIN A1C  11/27/2021   INFLUENZA VACCINE  12/03/2021   TETANUS/TDAP  11/03/2028   COLONOSCOPY (Pts 45-61yr Insurance coverage will need to be confirmed)  03/07/2030   Hepatitis C Screening  Completed   HIV Screening  Completed   HPV VACCINES  Aged Out   COVID-19 Vaccine  Discontinued    HM Colonoscopy          COLONOSCOPY (Pts 45-465yrInsurance coverage will need to be confirmed) (Every 10 Years) Next due on 03/07/2030    03/07/2020  Done - ENDOSCOPIC DIAGNOSIS: Normal COLONOSCOPY to the cecum.           Medications: Current Outpatient Medications on File Prior to Visit  Medication Sig Dispense Refill   fluticasone (FLONASE) 50 MCG/ACT nasal spray Place 1 spray into both nostrils daily.     levocetirizine (XYZAL) 5 MG tablet Take 5 mg by mouth every evening.     metFORMIN (GLUCOPHAGE-XR) 500 MG 24 hr tablet TAKE 1 TABLET (500 MG TOTAL) BY MOUTH DAILY WITH BREAKFAST FOR 90  DOSES. 90 tablet 1   Semaglutide,0.25 or 0.'5MG'$ /DOS, (OZEMPIC, 0.25 OR 0.5 MG/DOSE,) 2 MG/1.5ML SOPN Inject 0.5 mg into the skin once a week for 4 doses. 1.5 mL 0   No current facility-administered medications on file prior to visit.    Review of Systems: No headache, visual changes, nausea, vomiting, diarrhea, constipation, dizziness, abdominal pain, skin rash, fevers, chills, night sweats, swollen lymph nodes, weight loss, chest pain, body aches, joint swelling, muscle aches, shortness of breath, mood changes, visual or auditory hallucinations reported.  Objective:   Vitals:   09/25/21 1004  BP: 132/86  Pulse: 71  SpO2: 98%   Vitals:    09/25/21 1004  Weight: (!) 312 lb 6.4 oz (141.7 kg)  Height: 6' 1.25" (1.861 m)   Body mass index is 40.94 kg/m.  General: Well Developed, obese, well nourished, and in no acute distress.  Neuro: Alert and oriented x3, extra-ocular muscles intact, sensation grossly intact. Cranial nerves II through XII are grossly intact, motor, sensory, and coordinative functions are intact.  Diminished left Achilles reflex when compared to contralateral, remaining benign. HEENT: Normocephalic, atraumatic, pupils equal round reactive to light, neck supple, no masses, no lymphadenopathy, thyroid nonpalpable. Oropharynx, nasopharynx, external ear canals are unremarkable. Skin: Warm and dry, no rashes noted. Open comedos noted on upper back and right chest.  Calluses at the forefoot plantar regions bilaterally. Cardiac: Regular rate and rhythm, no murmurs rubs or gallops. No peripheral edema. Pulses symmetric. Respiratory: Clear to auscultation bilaterally. Not using accessory muscles, speaking in full sentences.  Abdominal: Soft, nontender, nondistended, positive bowel sounds, no masses, no organomegaly. Musculoskeletal: Shoulder, elbow, wrist, hip, knee, ankle stable, and with full range of motion.  Impression and Recommendations:   The patient was counselled, risk factors were discussed, and anticipatory guidance given.  Problem List Items Addressed This Visit       Cardiovascular and Mediastinum   Primary hypertension    Chronic, controlled on current BP regimen, did not dose this a.m., despite this his BP in reasonable range.       Relevant Medications   metoprolol succinate (TOPROL-XL) 25 MG 24 hr tablet   losartan-hydrochlorothiazide (HYZAAR) 100-25 MG tablet   Other Relevant Orders   Lipid panel   Apo A1 + B + Ratio   Comprehensive metabolic panel   Urine Microalbumin w/creat. ratio     Respiratory   Chronic rhinitis    Controlled with OTC allergy medications         Digestive    Colon polyp    Last colonoscopy 03/2020, advised next colonoscopy in 2023         Endocrine   Type 2 diabetes mellitus with complication (HCC)    Tolerating metformin and Ozempic regimen well, aggressively pursuing healthy lifestyle changes, recheck A1c and renal labs ordered today.  We will further titrate Ozempic as he is tolerating current dose well.  Referral to podiatry placed, he has seen eye doctor in the past year, will follow for diabetic eye exam per routine.       Relevant Medications   losartan-hydrochlorothiazide (HYZAAR) 100-25 MG tablet   Semaglutide, 1 MG/DOSE, 4 MG/3ML SOPN   Other Relevant Orders   Ambulatory referral to Podiatry   Lipid panel   Apo A1 + B + Ratio   Comprehensive metabolic panel   Hemoglobin A1c   Urine Microalbumin w/creat. ratio     Nervous and Auditory   Chronic left-sided lumbar radiculopathy    Chronic, has improved following continued  weight loss, has not resolved.         Musculoskeletal and Integument   Open comedone    Referral to dermatology placed, acute on chronic issue.       Relevant Orders   Ambulatory referral to Dermatology     Other   Annual physical exam - Primary    Annual examination completed, risk stratification labs ordered, anticipatory guidance provided.  We will follow labs once resulted.         Orders & Medications Medications:  Meds ordered this encounter  Medications   metoprolol succinate (TOPROL-XL) 25 MG 24 hr tablet    Sig: Take 1 tablet (25 mg total) by mouth daily.    Dispense:  90 tablet    Refill:  2   losartan-hydrochlorothiazide (HYZAAR) 100-25 MG tablet    Sig: Take 1 tablet by mouth daily.    Dispense:  90 tablet    Refill:  2    Please use GoodRx coupon card.   Semaglutide, 1 MG/DOSE, 4 MG/3ML SOPN    Sig: Inject 1 mg as directed once a week.    Dispense:  3 mL    Refill:  0   Orders Placed This Encounter  Procedures   Lipid panel   Apo A1 + B + Ratio   Comprehensive  metabolic panel   Hemoglobin A1c   Urine Microalbumin w/creat. ratio   Ambulatory referral to Podiatry   Ambulatory referral to Dermatology     No follow-ups on file.    [name], MD   Primary Care Sports Medicine [place]

## 2021-09-25 NOTE — Assessment & Plan Note (Signed)
Last colonoscopy 03/2020, advised next colonoscopy in 2023

## 2021-09-26 LAB — COMPREHENSIVE METABOLIC PANEL
ALT: 21 IU/L (ref 0–44)
AST: 16 IU/L (ref 0–40)
Albumin/Globulin Ratio: 1.5 (ref 1.2–2.2)
Albumin: 4.3 g/dL (ref 4.0–5.0)
Alkaline Phosphatase: 64 IU/L (ref 44–121)
BUN/Creatinine Ratio: 12 (ref 9–20)
BUN: 14 mg/dL (ref 6–24)
Bilirubin Total: 0.9 mg/dL (ref 0.0–1.2)
CO2: 26 mmol/L (ref 20–29)
Calcium: 9.6 mg/dL (ref 8.7–10.2)
Chloride: 100 mmol/L (ref 96–106)
Creatinine, Ser: 1.21 mg/dL (ref 0.76–1.27)
Globulin, Total: 2.9 g/dL (ref 1.5–4.5)
Glucose: 93 mg/dL (ref 70–99)
Potassium: 3.9 mmol/L (ref 3.5–5.2)
Sodium: 141 mmol/L (ref 134–144)
Total Protein: 7.2 g/dL (ref 6.0–8.5)
eGFR: 75 mL/min/{1.73_m2} (ref >59)

## 2021-09-26 LAB — LIPID PANEL
Chol/HDL Ratio: 4.4 ratio (ref 0.0–5.0)
Cholesterol, Total: 164 mg/dL (ref 100–199)
HDL: 37 mg/dL — ABNORMAL LOW (ref >39)
LDL Chol Calc (NIH): 109 mg/dL — ABNORMAL HIGH (ref 0–99)
Triglycerides: 97 mg/dL (ref 0–149)
VLDL Cholesterol Cal: 18 mg/dL (ref 5–40)

## 2021-09-26 LAB — APO A1 + B + RATIO
Apolipo. B/A-1 Ratio: 0.7 ratio (ref 0.0–0.7)
Apolipoprotein A-1: 119 mg/dL (ref 101–178)
Apolipoprotein B: 82 mg/dL (ref <90)

## 2021-09-26 LAB — MICROALBUMIN / CREATININE URINE RATIO
Creatinine, Urine: 388 mg/dL
Microalb/Creat Ratio: 5 mg/g creat (ref 0–29)
Microalbumin, Urine: 21.3 ug/mL

## 2021-09-26 LAB — HEMOGLOBIN A1C
Est. average glucose Bld gHb Est-mCnc: 120 mg/dL
Hgb A1c MFr Bld: 5.8 % — ABNORMAL HIGH (ref 4.8–5.6)

## 2021-09-26 NOTE — Telephone Encounter (Signed)
Refilled 09/25/21  Receipt confirmed by pharmacy 09/25/21 at 10:37 Requested Prescriptions  Pending Prescriptions Disp Refills  . metoprolol succinate (TOPROL-XL) 25 MG 24 hr tablet [Pharmacy Med Name: METOPROLOL SUCC ER 25 MG TAB] 90 tablet     Sig: TAKE 1 TABLET (25 MG TOTAL) BY MOUTH DAILY.     Cardiovascular:  Beta Blockers Passed - 09/25/2021  2:03 AM      Passed - Last BP in normal range    BP Readings from Last 1 Encounters:  09/25/21 132/86         Passed - Last Heart Rate in normal range    Pulse Readings from Last 1 Encounters:  09/25/21 71         Passed - Valid encounter within last 6 months    Recent Outpatient Visits          Yesterday Annual physical exam   [facility] [name], MD   2 months ago Type 2 diabetes mellitus with complication [facility])   [place], Jason J, MD   3 months ago Primary hypertension   [place], Jason J, MD   3 months ago Primary hypertension   [facility] [name], MD      Future Appointments            In 3 months Zigmund Daniel, Earley Abide, MD [facility], [facility]

## 2021-10-24 ENCOUNTER — Other Ambulatory Visit: Payer: Self-pay | Admitting: Family Medicine

## 2021-10-24 ENCOUNTER — Other Ambulatory Visit: Payer: Self-pay

## 2021-10-24 DIAGNOSIS — I10 Essential (primary) hypertension: Secondary | ICD-10-CM

## 2021-10-24 MED ORDER — LOSARTAN POTASSIUM-HCTZ 100-25 MG PO TABS: 1.0000 | ORAL_TABLET | Freq: Every day | ORAL | 0 refills | Status: DC

## 2021-10-24 NOTE — Telephone Encounter (Signed)
Requested medication (s) are due for refill today:   Yes  Requested medication (s) are on the active medication list:   Yes  Future visit scheduled:   Yes   Last ordered: 09/25/2021 #90, 2 refills  Returned because phar. Requesting a 1 yr supply   Requested Prescriptions  Pending Prescriptions Disp Refills   losartan-hydrochlorothiazide (HYZAAR) 100-25 MG tablet [Pharmacy Med Name: Losartan Potassium-HCTZ 100-25 MG Oral Tablet] 90 tablet 3    Sig: Take 1 tablet by mouth daily.     Cardiovascular: ARB + Diuretic Combos Passed - 10/24/2021  5:39 AM      Passed - K in normal range and within 180 days    Potassium  Date Value Ref Range Status  09/25/2021 3.9 3.5 - 5.2 mmol/L Final         Passed - Na in normal range and within 180 days    Sodium  Date Value Ref Range Status  09/25/2021 141 134 - 144 mmol/L Final         Passed - Cr in normal range and within 180 days    Creatinine, Ser  Date Value Ref Range Status  09/25/2021 1.21 0.76 - 1.27 mg/dL Final         Passed - eGFR is 10 or above and within 180 days    eGFR  Date Value Ref Range Status  09/25/2021 75 >59 mL/min/1.73 Final         Passed - Patient is not pregnant      Passed - Last BP in normal range    BP Readings from Last 1 Encounters:  09/25/21 132/86         Passed - Valid encounter within last 6 months    Recent Outpatient Visits           4 weeks ago Annual physical exam   [facility] [name], MD   3 months ago Type 2 diabetes mellitus with complication [facility])   [place], Jason J, MD   4 months ago Primary hypertension   [place], Jason J, MD   4 months ago Primary hypertension   [place], Jason J, MD       Future Appointments             In 2 months Zigmund Daniel, Earley Abide, MD [facility], [facility]

## 2021-10-29 ENCOUNTER — Other Ambulatory Visit: Payer: Self-pay | Admitting: Family Medicine

## 2021-10-29 DIAGNOSIS — E118 Type 2 diabetes mellitus with unspecified complications: Secondary | ICD-10-CM

## 2021-11-19 ENCOUNTER — Ambulatory Visit (INDEPENDENT_AMBULATORY_CARE_PROVIDER_SITE_OTHER): Payer: No Typology Code available for payment source | Admitting: Podiatry

## 2021-11-19 DIAGNOSIS — E0843 Diabetes mellitus due to underlying condition with diabetic autonomic (poly)neuropathy: Secondary | ICD-10-CM | POA: Diagnosis not present

## 2021-11-19 DIAGNOSIS — L989 Disorder of the skin and subcutaneous tissue, unspecified: Secondary | ICD-10-CM | POA: Diagnosis not present

## 2021-11-19 NOTE — Progress Notes (Signed)
   Chief Complaint  Patient presents with   diabetic foot care    Patient is here for diabetic foot care.    HPI: 46 y.o. male presenting today PMHx recently diagnosed with diabetes mellitus presenting for routine diabetic foot exam.  No pain.  Patient does develop symptomatic calluses to the medial aspect of the right great toe.  He says this has been present for several years.  He presents for further treatment and evaluation  Past Medical History:  Diagnosis Date   Allergy    Bronchitis 05/25/2018   Last Assessment & Plan:  Formatting of this note might be different from the original. Reviewed hx, sx, exam with pt Proceed with antibx With recent doxycycline and allergies, will proceed with rx levofloxacin. Also included short burst of po steroid. If symptoms not improved in next 3-5 days with treatment/plan recommended, patient was advised to follow up with office for further recommendations.   Cholecystitis    Deviated septum    Diabetes mellitus without complication (HCC)    GERD (gastroesophageal reflux disease)    Gout    Hypertension    Left Achilles tendinitis 11/04/2018   Last Assessment & Plan:  Formatting of this note might be different from the original. Follow up with Ortho   OSA on CPAP    Skin lesions 06/28/2019   Last Assessment & Plan:  Formatting of this note might be different from the original. This appears benign just monitor.    Past Surgical History:  Procedure Laterality Date   CHOLECYSTECTOMY  2020   WISDOM TOOTH EXTRACTION      Allergies  Allergen Reactions   Amoxil [Amoxicillin] Hives and Itching   Penicillins Hives and Itching   Soap Hives    Ivory Soap   Calcium Channel Blockers Other (See Comments)    bleeding     Physical Exam: General: The patient is alert and oriented x3 in no acute distress.  Dermatology: Skin is warm, dry and supple bilateral lower extremities. Negative for open lesions or macerations.  Hyperkeratotic callus noted to  the medial aspect of the right great toe  Vascular: Palpable pedal pulses bilaterally. Capillary refill within normal limits.  Negative for any significant edema or erythema  Neurological: Light touch and protective threshold grossly intact  Musculoskeletal Exam: No pedal deformities noted   Assessment: 1.  Diabetes mellitus with peripheral polyneuropathy 2.  Callus medial aspect of the right great toe   Plan of Care:  1. Patient evaluated.  Comprehensive diabetic foot exam performed today 2.  Excisional debridement of the callus was performed today using a tissue nipper without incident or bleeding 3.  Continue good foot hygiene.  Advised against going barefoot 4.  Return to clinic annually     Edrick Kins, DPM [facility]  Dr. Edrick Kins, DPM    [address], [place] [address]                Office [phone]  Fax [phone]

## 2021-11-27 ENCOUNTER — Other Ambulatory Visit: Payer: Self-pay | Admitting: Family Medicine

## 2021-11-27 DIAGNOSIS — E118 Type 2 diabetes mellitus with unspecified complications: Secondary | ICD-10-CM

## 2021-11-27 MED ORDER — OZEMPIC (1 MG/DOSE) 4 MG/3ML ~~LOC~~ SOPN: 1.0000 mg | PEN_INJECTOR | SUBCUTANEOUS | 0 refills | Status: DC

## 2021-12-28 ENCOUNTER — Other Ambulatory Visit: Payer: Self-pay | Admitting: Family Medicine

## 2021-12-28 DIAGNOSIS — E118 Type 2 diabetes mellitus with unspecified complications: Secondary | ICD-10-CM

## 2021-12-30 MED ORDER — OZEMPIC (1 MG/DOSE) 4 MG/3ML ~~LOC~~ SOPN: 1.0000 mg | PEN_INJECTOR | SUBCUTANEOUS | 0 refills | Status: DC

## 2022-01-01 ENCOUNTER — Ambulatory Visit: Payer: 59 | Admitting: Family Medicine

## 2022-01-01 ENCOUNTER — Ambulatory Visit
Admission: RE | Admit: 2022-01-01 | Discharge: 2022-01-01 | Disposition: A | Discharge status: Home / Self Care | Payer: 59 | Source: Ambulatory Visit | Attending: Family Medicine | Admitting: Family Medicine

## 2022-01-01 ENCOUNTER — Ambulatory Visit
Admission: RE | Admit: 2022-01-01 | Discharge: 2022-01-01 | Disposition: A | Discharge status: Home / Self Care | Payer: 59 | Attending: Family Medicine | Admitting: Family Medicine

## 2022-01-01 ENCOUNTER — Encounter: Payer: Self-pay | Admitting: Family Medicine

## 2022-01-01 VITALS — BP 138/88 | HR 70 | Ht 73.25 in | Wt 302.2 lb

## 2022-01-01 DIAGNOSIS — M79671 Pain in right foot: Secondary | ICD-10-CM

## 2022-01-01 DIAGNOSIS — M79672 Pain in left foot: Secondary | ICD-10-CM | POA: Insufficient documentation

## 2022-01-01 DIAGNOSIS — E118 Type 2 diabetes mellitus with unspecified complications: Secondary | ICD-10-CM

## 2022-01-01 DIAGNOSIS — I10 Essential (primary) hypertension: Secondary | ICD-10-CM | POA: Diagnosis not present

## 2022-01-01 NOTE — Assessment & Plan Note (Signed)
Tolerating Ozempic 1 mg well, initial worsened anorexia which has since subsided, has been vigilant about regular meal intake, cardiovascular exercise (walking), and continues to demonstrate steady significant weight loss.  He has lost 10 pounds over interval visit with BMI 39.60.  I have encouraged continued dietary and lifestyle modifications as he has been, incorporation of resistance based strength training, and we will continue on current dose.  Repeat A1c ordered

## 2022-01-01 NOTE — Assessment & Plan Note (Signed)
Chronic condition affecting bilateral feet, atraumatic in onset, ongoing for roughly 1 year.  Focal foot pain to the midfoot dorsum lateral in the setting of high level of walking.  He states that with prolonged walking, particularly tight fitting/shoes, focal pain to the base of the fourth/fifth metatarsal region symmetrically.  This pain resolves with appropriate rest, has not noted progressive pain and decreasing activity, no visible changes.  Examination reveals full, painless active range of motion, passive maximal flexion does elicit mild pain to the lateral midfoot, trace tenderness along the base of the fifth metatarsal and fourth metatarsal, single-leg heel raise and single-leg hop bilaterally negative.  Differential can include stress injury, reaction versus fracture though this is lower likelihood given his clinical history and exam findings, peroneal tendinopathy, degenerative changes based pain/arthralgias.  Plan for dedicated x-rays of bilateral feet, continue activity as tolerated, and we will follow-up on this issue at his return.

## 2022-01-01 NOTE — Progress Notes (Signed)
     Primary Care / Sports Medicine Office Visit  Patient Information:  Patient ID: Travis Petty, male DOB: 10/01/1975 Age: 46 y.o. MRN: [id number]   Travis Petty is a pleasant 46 y.o. male presenting with the following:  Chief Complaint  Patient presents with   Hypertension    Vitals:   01/01/22 0939  BP: 138/88  Pulse: 70  SpO2: 98%   Vitals:   01/01/22 0939  Weight: (!) 302 lb 3.2 oz (137.1 kg)  Height: 6' 1.25" (1.861 m)   Body mass index is 39.6 kg/m.  No results found.   Independent interpretation of notes and tests performed by another provider:   None  Procedures performed:   None  Pertinent History, Exam, Impression, and Recommendations:   Problem List Items Addressed This Visit       Cardiovascular and Mediastinum   Primary hypertension    Chronic, stable, and does not blood pressure medication yet today but has regularly been doing several.  He denies any cardiopulmonary complaints.  Continue current regimen with plan for recheck at his return.        Endocrine   Type 2 diabetes mellitus with complication (HCC) - Primary    Tolerating Ozempic 1 mg well, initial worsened anorexia which has since subsided, has been vigilant about regular meal intake, cardiovascular exercise (walking), and continues to demonstrate steady significant weight loss.  He has lost 10 pounds over interval visit with BMI 39.60.  I have encouraged continued dietary and lifestyle modifications as he has been, incorporation of resistance based strength training, and we will continue on current dose.  Repeat A1c ordered      Relevant Orders   Hemoglobin A1c     Other   Bilateral foot pain    Chronic condition affecting bilateral feet, atraumatic in onset, ongoing for roughly 1 year.  Focal foot pain to the midfoot dorsum lateral in the setting of high level of walking.  He states that with prolonged walking, particularly tight fitting/shoes, focal pain to the base of the  fourth/fifth metatarsal region symmetrically.  This pain resolves with appropriate rest, has not noted progressive pain and decreasing activity, no visible changes.  Examination reveals full, painless active range of motion, passive maximal flexion does elicit mild pain to the lateral midfoot, trace tenderness along the base of the fifth metatarsal and fourth metatarsal, single-leg heel raise and single-leg hop bilaterally negative.  Differential can include stress injury, reaction versus fracture though this is lower likelihood given his clinical history and exam findings, peroneal tendinopathy, degenerative changes based pain/arthralgias.  Plan for dedicated x-rays of bilateral feet, continue activity as tolerated, and we will follow-up on this issue at his return.      Relevant Orders   DG Foot Complete Left   DG Foot Complete Right     Orders & Medications No orders of the defined types were placed in this encounter.  Orders Placed This Encounter  Procedures   DG Foot Complete Left   DG Foot Complete Right   Hemoglobin A1c     Return in about 2 months (around 03/03/2022).     [name], MD   Primary Care Sports Medicine [facility]

## 2022-01-01 NOTE — Patient Instructions (Addendum)
-   Obtain x-rays and labs today - Continue 1 mg Ozempic dose - Add resistance / weight exercises (lifting weights, etc) - Return in 2 months - Contact for questions

## 2022-01-01 NOTE — Assessment & Plan Note (Signed)
Chronic, stable, and does not blood pressure medication yet today but has regularly been doing several.  He denies any cardiopulmonary complaints.  Continue current regimen with plan for recheck at his return.

## 2022-01-02 LAB — HEMOGLOBIN A1C
Est. average glucose Bld gHb Est-mCnc: 105 mg/dL
Hgb A1c MFr Bld: 5.3 % (ref 4.8–5.6)

## 2022-01-29 ENCOUNTER — Other Ambulatory Visit: Payer: Self-pay | Admitting: Family Medicine

## 2022-01-29 DIAGNOSIS — E118 Type 2 diabetes mellitus with unspecified complications: Secondary | ICD-10-CM

## 2022-01-29 MED ORDER — OZEMPIC (1 MG/DOSE) 4 MG/3ML ~~LOC~~ SOPN: 1.0000 mg | PEN_INJECTOR | SUBCUTANEOUS | 0 refills | Status: DC

## 2022-01-31 ENCOUNTER — Other Ambulatory Visit: Payer: Self-pay

## 2022-01-31 ENCOUNTER — Encounter: Payer: Self-pay | Admitting: Family Medicine

## 2022-01-31 DIAGNOSIS — E118 Type 2 diabetes mellitus with unspecified complications: Secondary | ICD-10-CM

## 2022-01-31 MED ORDER — OZEMPIC (1 MG/DOSE) 4 MG/3ML ~~LOC~~ SOPN: 1.0000 mg | PEN_INJECTOR | SUBCUTANEOUS | 0 refills | Status: DC

## 2022-03-05 ENCOUNTER — Ambulatory Visit: Payer: No Typology Code available for payment source | Admitting: Family Medicine

## 2022-03-05 ENCOUNTER — Encounter: Payer: Self-pay | Admitting: Family Medicine

## 2022-03-05 VITALS — BP 142/92 | HR 68 | Ht 73.25 in | Wt 305.0 lb

## 2022-03-05 DIAGNOSIS — M76822 Posterior tibial tendinitis, left leg: Secondary | ICD-10-CM | POA: Diagnosis not present

## 2022-03-05 DIAGNOSIS — I10 Essential (primary) hypertension: Secondary | ICD-10-CM | POA: Diagnosis not present

## 2022-03-05 DIAGNOSIS — M76821 Posterior tibial tendinitis, right leg: Secondary | ICD-10-CM

## 2022-03-05 DIAGNOSIS — E118 Type 2 diabetes mellitus with unspecified complications: Secondary | ICD-10-CM | POA: Diagnosis not present

## 2022-03-05 HISTORY — DX: Posterior tibial tendinitis, right leg: M76.821

## 2022-03-05 MED ORDER — SEMAGLUTIDE (2 MG/DOSE) 8 MG/3ML ~~LOC~~ SOPN: 2.0000 mg | PEN_INJECTOR | SUBCUTANEOUS | 3 refills | Status: DC

## 2022-03-05 NOTE — Assessment & Plan Note (Signed)
Chronic issue, atraumatic in onset, noted with increased physical activity (walking).  Did note this recently on a trip to [place], midfoot dorsal lateral to the ankle, does have a history of Achilles tendinitis.  Given his radiographic findings of subtle degenerative changes, did advise course of home-based rehab, as needed usage of topical diclofenac 1%.  Can follow-up on as-needed basis for this issue.

## 2022-03-05 NOTE — Assessment & Plan Note (Signed)
Chronic condition, last A1c in range, has been essentially weight neutral, discussed the risk versus benefits with further titration of Ozempic.  Plan for escalation to Ozempic 2 mg subcutaneously weekly, follow-up in 3 months with plan for repeat A1c at that time.

## 2022-03-05 NOTE — Progress Notes (Signed)
     Primary Care / Sports Medicine Office Visit  Patient Information:  Patient ID: Travis Petty, male DOB: 08-28-1975 Age: 46 y.o. MRN: [id number]   Travis Petty is a pleasant 46 y.o. male presenting with the following:  Chief Complaint  Patient presents with   foot pain    Bilateral pain after walking long distance   Hypertension    Tolerating medicaiton well     Vitals:   03/05/22 0944 03/05/22 0946  BP: (!) 144/94 (!) 142/92  Pulse: 68   SpO2: 99%    Vitals:   03/05/22 0944  Weight: (!) 305 lb (138.3 kg)  Height: 6' 1.25" (1.861 m)   Body mass index is 39.97 kg/m.  No results found.   Independent interpretation of notes and tests performed by another provider:   Independent interpretation of bilateral foot x-rays dated 01/01/2022 reveal no acute osseous processes, posterior prominent Achilles enthesophyte bilaterally, degenerative changes right first TMT, secondarily to the base of the fifth metatarsal.  Procedures performed:   None  Pertinent History, Exam, Impression, and Recommendations:   Problem List Items Addressed This Visit       Cardiovascular and Mediastinum   Primary hypertension    Asymptomatic from a cardiopulmonary standpoint, usually doses his antihypertensives at 10 AM, does state worsened lifestyle changes due to increased travel, plans on adhering closer to lifestyle modifications that he has had success with in the past this month onwards.  Follow-up in 3 months.        Endocrine   Type 2 diabetes mellitus with complication (HCC)    Chronic condition, last A1c in range, has been essentially weight neutral, discussed the risk versus benefits with further titration of Ozempic.  Plan for escalation to Ozempic 2 mg subcutaneously weekly, follow-up in 3 months with plan for repeat A1c at that time.      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  Did note this recently on a trip to [place], midfoot dorsal lateral to the ankle, does have a history of Achilles tendinitis.  Given his radiographic findings of subtle degenerative changes, did advise course of home-based rehab, as needed usage of topical diclofenac 1%.  Can follow-up on as-needed basis for this issue.        Orders & Medications Meds ordered this encounter  Medications   Semaglutide, 2 MG/DOSE, 8 MG/3ML SOPN    Sig: Inject 2 mg as directed once a week.    Dispense:  3 mL    Refill:  3   No orders of the defined types were placed in this encounter.    Return in about 3 months (around 06/05/2022).     [name], MD   Primary Care Sports Medicine [place]

## 2022-03-05 NOTE — Patient Instructions (Addendum)
-   Finish out 1 mg Ozempic - Alternatively, can transition to 2 mg Ozempic - Continue course until follow-up - Start home exercise with information provided for both feet - Can use topical over-the-counter medication diclofenac 1% (Voltaren gel) up to 4 times a day as needed - Return for follow-up in 3 months, contact for questions/concerns between now and then

## 2022-03-05 NOTE — Assessment & Plan Note (Signed)
Asymptomatic from a cardiopulmonary standpoint, usually doses his antihypertensives at 10 AM, does state worsened lifestyle changes due to increased travel, plans on adhering closer to lifestyle modifications that he has had success with in the past this month onwards.  Follow-up in 3 months.

## 2022-03-06 ENCOUNTER — Encounter: Payer: Self-pay | Admitting: Family Medicine

## 2022-03-14 ENCOUNTER — Other Ambulatory Visit: Payer: Self-pay | Admitting: Family Medicine

## 2022-03-14 DIAGNOSIS — E118 Type 2 diabetes mellitus with unspecified complications: Secondary | ICD-10-CM

## 2022-03-14 NOTE — Telephone Encounter (Signed)
Requested Prescriptions  Pending Prescriptions Disp Refills   metFORMIN (GLUCOPHAGE-XR) 500 MG 24 hr tablet [Pharmacy Med Name: METFORMIN HCL ER 500 MG TABLET] 90 tablet 0    Sig: TAKE 1 TABLET (500 MG TOTAL) BY MOUTH DAILY WITH BREAKFAST FOR 90 DOSES.     Endocrinology:  Diabetes - Biguanides Passed - 03/14/2022  2:30 AM      Passed - Cr in normal range and within 360 days    Creatinine, Ser  Date Value Ref Range Status  09/25/2021 1.21 0.76 - 1.27 mg/dL Final         Passed - HBA1C is between 0 and 7.9 and within 180 days    Hgb A1c MFr Bld  Date Value Ref Range Status  01/01/2022 5.3 4.8 - 5.6 % Final    Comment:             Prediabetes: 5.7 - 6.4          Diabetes: >6.4          Glycemic control for adults with diabetes: <7.0          Passed - eGFR in normal range and within 360 days    eGFR  Date Value Ref Range Status  09/25/2021 75 >59 mL/min/1.73 Final         Passed - B12 Level in normal range and within 720 days    Vitamin B-12  Date Value Ref Range Status  05/30/2021 462 232 - 1,245 pg/mL Final         Passed - Valid encounter within last 6 months    Recent Outpatient Visits           1 week ago Posterior tibialis tendinitis of both lower extremities   [place] [facility] at [place], Earley Abide, MD   2 months ago Type 2 diabetes mellitus with complication [facility])   [place] [facility] at [place], Earley Abide, MD   5 months ago Annual physical exam   [facility] at [facility], Earley Abide, MD   7 months ago Type 2 diabetes mellitus with complication [facility])   [place] [facility] Medicine at [place], Earley Abide, MD   8 months ago Primary hypertension   [place] [facility] and [facility] Medicine at [place], Earley Abide, MD       Future Appointments             In 2 months Zigmund Daniel, Earley Abide,  MD [facility] Primary Care and Sports Medicine at [place], [place] within normal limits and completed in the last 12 months    WBC  Date Value Ref Range Status  05/30/2021 5.5 3.4 - 10.8 x10E3/uL Final   RBC  Date Value Ref Range Status  05/30/2021 5.34 4.14 - 5.80 x10E6/uL Final   Hemoglobin  Date Value Ref Range Status  05/30/2021 15.3 13.0 - 17.7 g/dL Final   Hematocrit  Date Value Ref Range Status  05/30/2021 43.4 37.5 - 51.0 % Final   MCHC  Date Value Ref Range Status  05/30/2021 35.3 31.5 - 35.7 g/dL Final   [facility]  Date Value Ref Range Status  05/30/2021 28.7 26.6 - 33.0 pg Final   MCV  Date Value Ref Range Status  05/30/2021 81 79 - 97 fL Final  No results found for: "PLTCOUNTKUC", "LABPLAT", "POCPLA" RDW  Date Value Ref Range Status  05/30/2021 13.4 11.6 - 15.4 % Final

## 2022-04-17 ENCOUNTER — Other Ambulatory Visit: Payer: Self-pay

## 2022-04-17 ENCOUNTER — Encounter: Payer: Self-pay | Admitting: Family Medicine

## 2022-04-17 DIAGNOSIS — E118 Type 2 diabetes mellitus with unspecified complications: Secondary | ICD-10-CM

## 2022-04-17 MED ORDER — OZEMPIC (1 MG/DOSE) 4 MG/3ML ~~LOC~~ SOPN: 1.0000 mg | PEN_INJECTOR | SUBCUTANEOUS | 0 refills | Status: DC

## 2022-04-17 NOTE — Telephone Encounter (Signed)
Ok to send

## 2022-04-17 NOTE — Telephone Encounter (Signed)
Medication sent.

## 2022-04-18 LAB — HM DIABETES EYE EXAM

## 2022-06-04 ENCOUNTER — Ambulatory Visit: Payer: 59 | Admitting: Family Medicine

## 2022-06-10 ENCOUNTER — Ambulatory Visit: Payer: No Typology Code available for payment source | Admitting: Family Medicine

## 2022-06-10 ENCOUNTER — Encounter: Payer: Self-pay | Admitting: Family Medicine

## 2022-06-10 VITALS — BP 140/94 | HR 78 | Ht 73.25 in | Wt 304.0 lb

## 2022-06-10 DIAGNOSIS — I10 Essential (primary) hypertension: Secondary | ICD-10-CM | POA: Diagnosis not present

## 2022-06-10 DIAGNOSIS — Z114 Encounter for screening for human immunodeficiency virus [HIV]: Secondary | ICD-10-CM | POA: Diagnosis not present

## 2022-06-10 DIAGNOSIS — E118 Type 2 diabetes mellitus with unspecified complications: Secondary | ICD-10-CM

## 2022-06-10 DIAGNOSIS — G4733 Obstructive sleep apnea (adult) (pediatric): Secondary | ICD-10-CM

## 2022-06-10 DIAGNOSIS — R1909 Other intra-abdominal and pelvic swelling, mass and lump: Secondary | ICD-10-CM | POA: Insufficient documentation

## 2022-06-10 MED ORDER — METFORMIN HCL ER 500 MG PO TB24: 500.0000 mg | ORAL_TABLET | Freq: Every day | ORAL | 0 refills | Status: DC

## 2022-06-10 MED ORDER — METOPROLOL SUCCINATE ER 25 MG PO TB24: 25.0000 mg | ORAL_TABLET | Freq: Every day | ORAL | 1 refills | Status: DC

## 2022-06-10 MED ORDER — FLUTICASONE PROPIONATE 50 MCG/ACT NA SUSP: 1.0000 | Freq: Every day | NASAL | 1 refills | Status: DC

## 2022-06-10 MED ORDER — LOSARTAN POTASSIUM-HCTZ 100-25 MG PO TABS: 1.0000 | ORAL_TABLET | Freq: Every day | ORAL | 1 refills | Status: DC

## 2022-06-10 MED ORDER — LEVOCETIRIZINE DIHYDROCHLORIDE 5 MG PO TABS: 5.0000 mg | ORAL_TABLET | Freq: Every evening | ORAL | 1 refills | Status: DC

## 2022-06-10 MED ORDER — SEMAGLUTIDE-WEIGHT MANAGEMENT 1.7 MG/0.75ML ~~LOC~~ SOAJ: 1.7000 mg | SUBCUTANEOUS | 3 refills | Status: DC

## 2022-06-10 NOTE — Assessment & Plan Note (Signed)
Has had intermittent compliance due to travel, discussed regular usage due to comorbidities.Amenable to restart and regular usage.Marland Kitchen

## 2022-06-10 NOTE — Assessment & Plan Note (Signed)
Chronic, has been recalcitrant despite titration of lisinopril-HCTZ, in setting of CCB intolerance. Benign cardiopulmonary findings. Plan for RAS evaluation and referral to cardiology.

## 2022-06-10 NOTE — Assessment & Plan Note (Signed)
Noted over the past several days, improving with time alone. Had been performing grooming in region. Exam with apparent right minimally tender inguinal lymph node vs folliculitis vs abscess. No increase with valsalva, remaining findings benign. Advised supportive care and to contact us by end of week if not improving.

## 2022-06-10 NOTE — Assessment & Plan Note (Signed)
Chronic, stable, A1c rechecked today and reassuring. Will further titrate Ozempic with weight loss optimization goal.

## 2022-06-10 NOTE — Progress Notes (Signed)
Primary Care / Sports Medicine Office Visit  Patient Information:  Patient ID: Travis Petty, male DOB: [birth year] Age: 47 y.o. MRN: [id number]   Travis Petty is a pleasant 47 y.o. male presenting with the following:  Chief Complaint  Patient presents with   Diabetes   Hypertension    Vitals:   06/10/22 1444  BP: (!) 140/94  Pulse: 78  SpO2: 97%   Vitals:   06/10/22 1444  Weight: (!) 304 lb (137.9 kg)  Height: 6' 1.25" (1.861 m)   Body mass index is 39.83 kg/m.  No results found.   Independent interpretation of notes and tests performed by another provider:   None  Procedures performed:   None  Pertinent History, Exam, Impression, and Recommendations:   [name] was seen today for diabetes and hypertension.  Primary hypertension Assessment & Plan: Chronic, has been recalcitrant despite titration of lisinopril-HCTZ, in setting of CCB intolerance. Benign cardiopulmonary findings. Plan for RAS evaluation and referral to cardiology.  Orders: -     Losartan Potassium-HCTZ; Take 1 tablet by mouth daily.  Dispense: 90 tablet; Refill: 1 -     Metoprolol Succinate ER; Take 1 tablet (25 mg total) by mouth daily.  Dispense: 90 tablet; Refill: 1 -     US RENAL ARTERY DUPLEX COMPLETE; Future  Type 2 diabetes mellitus with complication (HCC) Assessment & Plan: Chronic, stable, A1c rechecked today and reassuring. Will further titrate Ozempic with weight loss optimization goal.  Orders: -     metFORMIN HCl ER; Take 1 tablet (500 mg total) by mouth daily with breakfast for 90 doses.  Dispense: 90 tablet; Refill: 0 -     Semaglutide-Weight Management; Inject 1.7 mg into the skin once a week.  Dispense: 3 mL; Refill: 3  Screening for HIV (human immunodeficiency virus) -     HIV Antibody (routine testing w rflx)  OSA on CPAP Assessment & Plan: Has had intermittent compliance due to travel, discussed regular usage due to comorbidities.Amenable to restart and regular  usage..   Mass of right inguinal region Assessment & Plan: Noted over the past several days, improving with time alone. Had been performing grooming in region. Exam with apparent right minimally tender inguinal lymph node vs folliculitis vs abscess. No increase with valsalva, remaining findings benign. Advised supportive care and to contact us by end of week if not improving.   Other orders -     Fluticasone Propionate; Place 1 spray into both nostrils daily.  Dispense: 16 g; Refill: 1 -     Levocetirizine Dihydrochloride; Take 1 tablet (5 mg total) by mouth every evening.  Dispense: 90 tablet; Refill: 1     Orders & Medications Meds ordered this encounter  Medications   fluticasone (FLONASE) 50 MCG/ACT nasal spray    Sig: Place 1 spray into both nostrils daily.    Dispense:  16 g    Refill:  1   levocetirizine (XYZAL) 5 MG tablet    Sig: Take 1 tablet (5 mg total) by mouth every evening.    Dispense:  90 tablet    Refill:  1   losartan-hydrochlorothiazide (HYZAAR) 100-25 MG tablet    Sig: Take 1 tablet by mouth daily.    Dispense:  90 tablet    Refill:  1   metFORMIN (GLUCOPHAGE-XR) 500 MG 24 hr tablet    Sig: Take 1 tablet (500 mg total) by mouth daily with breakfast for 90 doses.    Dispense:  90  tablet    Refill:  0   metoprolol succinate (TOPROL-XL) 25 MG 24 hr tablet    Sig: Take 1 tablet (25 mg total) by mouth daily.    Dispense:  90 tablet    Refill:  1   Semaglutide-Weight Management 1.7 MG/0.75ML SOAJ    Sig: Inject 1.7 mg into the skin once a week.    Dispense:  3 mL    Refill:  3   Orders Placed This Encounter  Procedures   US Renal Artery Stenosis   HIV Antibody (routine testing w rflx)     Return in about 3 months (around 09/08/2022) for CPE.     [name], MD, [facility]   Primary Care Sports Medicine Primary Care and Sports Medicine at [facility]

## 2022-06-10 NOTE — Patient Instructions (Signed)
-   Take new dose of Ozempic - Use moist heat and gentle massage at swelling, if not improving through week, contact us by end of week for next steps - Coordinator will contact you to schedule ultrasound and visit with cardiology - Continue use of CPAP - Return in 3 months for physical

## 2022-06-11 LAB — HIV ANTIBODY (ROUTINE TESTING W REFLEX): HIV Screen 4th Generation wRfx: NONREACTIVE

## 2022-06-16 ENCOUNTER — Ambulatory Visit
Admission: RE | Admit: 2022-06-16 | Discharge: 2022-06-16 | Disposition: A | Discharge status: Home / Self Care | Payer: 59 | Source: Ambulatory Visit | Attending: Family Medicine | Admitting: Family Medicine

## 2022-06-16 ENCOUNTER — Ambulatory Visit
Admission: RE | Admit: 2022-06-16 | Discharge: 2022-06-16 | Disposition: A | Discharge status: Home / Self Care | Payer: 59 | Attending: Family Medicine | Admitting: Family Medicine

## 2022-06-16 ENCOUNTER — Ambulatory Visit (INDEPENDENT_AMBULATORY_CARE_PROVIDER_SITE_OTHER): Payer: No Typology Code available for payment source | Admitting: Family Medicine

## 2022-06-16 ENCOUNTER — Encounter: Payer: Self-pay | Admitting: Family Medicine

## 2022-06-16 VITALS — BP 138/88 | HR 97 | Ht 73.25 in | Wt 301.0 lb

## 2022-06-16 DIAGNOSIS — M79671 Pain in right foot: Secondary | ICD-10-CM | POA: Diagnosis present

## 2022-06-16 MED ORDER — PREDNISONE 50 MG PO TABS: 50.0000 mg | ORAL_TABLET | Freq: Every day | ORAL | 0 refills | Status: DC

## 2022-06-16 MED ORDER — HYDROCODONE-ACETAMINOPHEN 5-325 MG PO TABS: 1.0000 | ORAL_TABLET | Freq: Three times a day (TID) | ORAL | 0 refills | Status: DC | PRN

## 2022-06-16 NOTE — Patient Instructions (Signed)
-   Obtain x-ray today - Remain in cam boot at all times as if it were a cast, okay to remove for sleeping, bathing, periods of prolonged inactivity - Start prednisone, take for full course - Can use hydrocodone for breakthrough pain - We will contact you with x-ray results and next steps - Contact our office for any question/concerns between now and then

## 2022-06-17 ENCOUNTER — Telehealth: Payer: Self-pay | Admitting: Family Medicine

## 2022-06-17 NOTE — Progress Notes (Signed)
     Primary Care / Sports Medicine Office Visit  Patient Information:  Patient ID: Travis Petty, male DOB: June 13, 1975 Age: 47 y.o. MRN: [id number]   Travis Petty is a pleasant 47 y.o. male presenting with the following:  Chief Complaint  Patient presents with   Foot Pain    Has had gout before, right, 2 days, woke up with it swollen. Stepping down doesn't hurt     Vitals:   06/16/22 1613  BP: 138/88  Pulse: 97  SpO2: 99%   Vitals:   06/16/22 1613  Weight: (!) 301 lb (136.5 kg)  Height: 6' 1.25" (1.861 m)   Body mass index is 39.44 kg/m.  No results found.   Independent interpretation of notes and tests performed by another provider:   None  Procedures performed:   None  Pertinent History, Exam, Impression, and Recommendations:   [name] was seen today for foot pain.  Pain of midfoot, right Assessment & Plan: Acute on chronic, denies any trauma prior to recent exacerbation but had been walking on some uneven surfaces prior, noted swelling, severe pain to the point of gait alteration.  Examination with mild erythema and swelling about the right lateral midfoot, focal tenderness along the fourth and fifth midshaft metatarsals, no crepitus, painful range of motion.  Nontender about the malleoli, first MTP, Achilles, plantar fascia.  Given the acuity and focality of symptoms, dedicated x-rays, cam boot, hydrocodone as needed, and prednisone course advised.  Will coordinate follow-up pending x-rays.  Orders: -     predniSONE; Take 1 tablet (50 mg total) by mouth daily.  Dispense: 5 tablet; Refill: 0 -     HYDROcodone-Acetaminophen; Take 1 tablet by mouth every 8 (eight) hours as needed for moderate pain.  Dispense: 15 tablet; Refill: 0 -     DG Foot Complete Right; Future  Right foot pain     Orders & Medications Meds ordered this encounter  Medications   predniSONE (DELTASONE) 50 MG tablet    Sig: Take 1 tablet (50 mg total) by mouth daily.     Dispense:  5 tablet    Refill:  0   HYDROcodone-acetaminophen (NORCO/VICODIN) 5-325 MG tablet    Sig: Take 1 tablet by mouth every 8 (eight) hours as needed for moderate pain.    Dispense:  15 tablet    Refill:  0   Orders Placed This Encounter  Procedures   DG Foot Complete Right   HM DIABETES EYE EXAM     No follow-ups on file.     [name], MD, [facility]   Primary Care Sports Medicine Primary Care and Sports Medicine at [facility]

## 2022-06-17 NOTE — Telephone Encounter (Signed)
Please advise 

## 2022-06-17 NOTE — Telephone Encounter (Signed)
Copied from [facility]. Topic: General - Other >> Jun 17, 2022  2:21 PM Chapman Fitch wrote: Reason for CRM: Pt would like a call when xray results come in / please advise

## 2022-06-17 NOTE — Assessment & Plan Note (Signed)
Acute on chronic, denies any trauma prior to recent exacerbation but had been walking on some uneven surfaces prior, noted swelling, severe pain to the point of gait alteration.  Examination with mild erythema and swelling about the right lateral midfoot, focal tenderness along the fourth and fifth midshaft metatarsals, no crepitus, painful range of motion.  Nontender about the malleoli, first MTP, Achilles, plantar fascia.  Given the acuity and focality of symptoms, dedicated x-rays, cam boot, hydrocodone as needed, and prednisone course advised.  Will coordinate follow-up pending x-rays.

## 2022-06-18 ENCOUNTER — Ambulatory Visit
Admission: RE | Admit: 2022-06-18 | Discharge: 2022-06-18 | Disposition: A | Discharge status: Home / Self Care | Payer: 59 | Source: Ambulatory Visit | Attending: Family Medicine | Admitting: Family Medicine

## 2022-06-18 DIAGNOSIS — I10 Essential (primary) hypertension: Secondary | ICD-10-CM | POA: Insufficient documentation

## 2022-06-19 ENCOUNTER — Encounter: Payer: Self-pay | Admitting: Family Medicine

## 2022-06-19 NOTE — Telephone Encounter (Signed)
Please advise 

## 2022-06-30 ENCOUNTER — Other Ambulatory Visit: Payer: Self-pay | Admitting: Family Medicine

## 2022-06-30 DIAGNOSIS — I10 Essential (primary) hypertension: Secondary | ICD-10-CM

## 2022-07-01 NOTE — Telephone Encounter (Signed)
Unable to refill per protocol, Rx request is too soon. Last refill 06/10/22 for 90 days and 1 refill.  Requested Prescriptions  Pending Prescriptions Disp Refills   metoprolol succinate (TOPROL-XL) 25 MG 24 hr tablet [Pharmacy Med Name: METOPROLOL SUCC ER 25 MG TAB] 90 tablet 2    Sig: TAKE 1 TABLET (25 MG TOTAL) BY MOUTH DAILY.     Cardiovascular:  Beta Blockers Passed - 06/30/2022  1:49 AM      Passed - Last BP in normal range    BP Readings from Last 1 Encounters:  06/16/22 138/88         Passed - Last Heart Rate in normal range    Pulse Readings from Last 1 Encounters:  06/16/22 97         Passed - Valid encounter within last 6 months    Recent Outpatient Visits           2 weeks ago Pain of midfoot, right   [place] at [place], Earley Abide, MD   3 weeks ago Primary hypertension    Primary Care & Sports Medicine at [place], Earley Abide, MD   3 months ago Posterior tibialis tendinitis of both lower extremities   [place] at [place], Earley Abide, MD   6 months ago Type 2 diabetes mellitus with complication [facility])   [place] at [place], Earley Abide, MD   9 months ago Annual physical exam   [place] at [facility], Earley Abide, MD       Future Appointments             In 2 months Zigmund Daniel, Earley Abide, MD [place] at [facility], [facility]

## 2022-07-16 ENCOUNTER — Encounter: Payer: Self-pay | Admitting: Family Medicine

## 2022-07-17 ENCOUNTER — Other Ambulatory Visit: Payer: Self-pay | Admitting: Family Medicine

## 2022-07-17 DIAGNOSIS — E118 Type 2 diabetes mellitus with unspecified complications: Secondary | ICD-10-CM

## 2022-07-17 MED ORDER — SEMAGLUTIDE (2 MG/DOSE) 8 MG/3ML ~~LOC~~ SOPN: 2.0000 mg | PEN_INJECTOR | SUBCUTANEOUS | 3 refills | Status: DC

## 2022-07-17 NOTE — Telephone Encounter (Signed)
Please advise 

## 2022-09-10 ENCOUNTER — Ambulatory Visit (INDEPENDENT_AMBULATORY_CARE_PROVIDER_SITE_OTHER): Payer: No Typology Code available for payment source | Admitting: Family Medicine

## 2022-09-10 ENCOUNTER — Other Ambulatory Visit: Payer: Self-pay

## 2022-09-10 ENCOUNTER — Encounter: Payer: Self-pay | Admitting: Family Medicine

## 2022-09-10 VITALS — BP 128/78 | HR 72 | Ht 73.0 in | Wt 299.8 lb

## 2022-09-10 DIAGNOSIS — E559 Vitamin D deficiency, unspecified: Secondary | ICD-10-CM

## 2022-09-10 DIAGNOSIS — Z0000 Encounter for general adult medical examination without abnormal findings: Secondary | ICD-10-CM

## 2022-09-10 DIAGNOSIS — E118 Type 2 diabetes mellitus with unspecified complications: Secondary | ICD-10-CM | POA: Diagnosis not present

## 2022-09-10 DIAGNOSIS — N6323 Unspecified lump in the left breast, lower outer quadrant: Secondary | ICD-10-CM | POA: Insufficient documentation

## 2022-09-10 DIAGNOSIS — Z803 Family history of malignant neoplasm of breast: Secondary | ICD-10-CM

## 2022-09-10 DIAGNOSIS — I10 Essential (primary) hypertension: Secondary | ICD-10-CM

## 2022-09-10 DIAGNOSIS — Z7984 Long term (current) use of oral hypoglycemic drugs: Secondary | ICD-10-CM

## 2022-09-10 DIAGNOSIS — N6341 Unspecified lump in right breast, subareolar: Secondary | ICD-10-CM

## 2022-09-10 MED ORDER — AZELASTINE HCL 0.05 % OP SOLN: 1.0000 [drp] | Freq: Two times a day (BID) | OPHTHALMIC | 0 refills | Status: DC

## 2022-09-10 NOTE — Assessment & Plan Note (Signed)
Instantly noted, left greater than right, subareolar and lower outer quadrants, in the setting of strong family history of breast cancer on paternal side of the family.  -Ultrasound breast bilaterally

## 2022-09-10 NOTE — Progress Notes (Signed)
Annual Physical Exam Visit  Patient Information:  Patient ID: Travis Petty, male DOB: 01-08-1976 Age: 47 y.o. MRN: [id number]   Subjective:   CC: Annual Physical Exam  HPI:  Travis Petty is here for their annual physical.  I reviewed the past medical history, family history, social history, surgical history, and allergies today and changes were made as necessary.  Please see the problem list section below for additional details.  Past Medical History: Past Medical History:  Diagnosis Date   Allergy    Bronchitis 05/25/2018   Last Assessment & Plan:  Formatting of this note might be different from the original. Reviewed hx, sx, exam with pt Proceed with antibx With recent doxycycline and allergies, will proceed with rx levofloxacin. Also included short burst of po steroid. If symptoms not improved in next 3-5 days with treatment/plan recommended, patient was advised to follow up with office for further recommendations.   Cholecystitis    Deviated septum    GERD (gastroesophageal reflux disease)    Gout    Hypertension    Left Achilles tendinitis 11/04/2018   Last Assessment & Plan:  Formatting of this note might be different from the original. Follow up with Ortho   OSA on CPAP    Skin lesions 06/28/2019   Last Assessment & Plan:  Formatting of this note might be different from the original. This appears benign just monitor.   Past Surgical History: Past Surgical History:  Procedure Laterality Date   CHOLECYSTECTOMY  2020   WISDOM TOOTH EXTRACTION     Family History: Family History  Problem Relation Age of Onset   Hypertension Mother    Allergies Mother    Thyroid disease Mother    Hypertension Father    Dementia Father    Prostate cancer Paternal Uncle    Hypertension Maternal Grandmother    Hypertension Maternal Grandfather    Hypertension Paternal Grandmother    Colon cancer Paternal Grandmother    Hypertension Paternal Grandfather     Allergies: Allergies  Allergen Reactions   Amoxil [Amoxicillin] Hives and Itching   Penicillins Hives and Itching   Soap Hives    Ivory Soap   Calcium Channel Blockers Other (See Comments)    bleeding   Health Maintenance: Health Maintenance  Topic Date Due   HEMOGLOBIN A1C  07/03/2022   Diabetic kidney evaluation - eGFR measurement  09/26/2022   Diabetic kidney evaluation - Urine ACR  09/26/2022   FOOT EXAM  11/20/2022   INFLUENZA VACCINE  12/04/2022   OPHTHALMOLOGY EXAM  04/19/2023   DTaP/Tdap/Td (2 - Td or Tdap) 11/03/2028   COLONOSCOPY (Pts 45-62yrs Insurance coverage will need to be confirmed)  03/07/2030   Hepatitis C Screening  Completed   HIV Screening  Completed   HPV VACCINES  Aged Out   COVID-19 Vaccine  Discontinued    HM Colonoscopy          COLONOSCOPY (Pts 45-23yrs Insurance coverage will need to be confirmed) (Every 10 Years) Next due on 03/07/2030    03/07/2020  Done - ENDOSCOPIC DIAGNOSIS: Normal COLONOSCOPY to the cecum.           Medications: Current Outpatient Medications on File Prior to Visit  Medication Sig Dispense Refill   fluticasone (FLONASE) 50 MCG/ACT nasal spray Place 1 spray into both nostrils daily. 16 g 1   levocetirizine (XYZAL) 5 MG tablet Take 1 tablet (5 mg total) by mouth every evening. 90 tablet 1   losartan-hydrochlorothiazide (  HYZAAR) 100-25 MG tablet Take 1 tablet by mouth daily. 90 tablet 1   metFORMIN (GLUCOPHAGE-XR) 500 MG 24 hr tablet Take 1 tablet (500 mg total) by mouth daily with breakfast for 90 doses. 90 tablet 0   metoprolol succinate (TOPROL-XL) 25 MG 24 hr tablet Take 1 tablet (25 mg total) by mouth daily. 90 tablet 1   Semaglutide, 2 MG/DOSE, 8 MG/3ML SOPN Inject 2 mg as directed once a week. 3 mL 3   No current facility-administered medications on file prior to visit.    Review of Systems: No headache, visual changes, nausea, vomiting, diarrhea, constipation, dizziness, abdominal pain, skin rash, fevers,  chills, night sweats, swollen lymph nodes, weight loss, chest pain, body aches, joint swelling, muscle aches, shortness of breath, mood changes, visual or auditory hallucinations reported.  Objective:   Vitals:   09/10/22 0855  BP: 128/78  Pulse: 72  SpO2: 98%   Vitals:   09/10/22 0855  Weight: 299 lb 12.8 oz (136 kg)  Height: 6\' 1"  (1.854 m)   Body mass index is 39.55 kg/m.  General: Well Developed, well nourished, and in no acute distress.  Neuro: Alert and oriented x3, extra-ocular muscles intact, sensation grossly intact. Cranial nerves II through XII are grossly intact, motor, sensory, and coordinative functions are intact. HEENT: Normocephalic, atraumatic, pupils equal round reactive to light, neck supple, no masses, no lymphadenopathy, thyroid nonpalpable. Oropharynx, nasopharynx, external ear canals are unremarkable. Skin: Warm and dry, no rashes noted.  Nontender subareolar and lower outer quadrant palpable masses bilaterally, left greater than right, mobile, smooth borders, no associated axillary lymphadenopathy bilaterally Cardiac: Regular rate and rhythm, no murmurs rubs or gallops. No peripheral edema. Pulses symmetric. Respiratory: Clear to auscultation bilaterally. Not using accessory muscles, speaking in full sentences.  Abdominal: Soft, nontender, nondistended, positive bowel sounds, no masses, no organomegaly. Musculoskeletal: Shoulder, elbow, wrist, hip, knee, ankle stable, and with full range of motion.  Impression and Recommendations:   The patient was counselled, risk factors were discussed, and anticipatory guidance given.  Problem List Items Addressed This Visit       Cardiovascular and Mediastinum   Primary hypertension   Relevant Orders   Comprehensive metabolic panel   Lipid panel   TSH     Endocrine   Type 2 diabetes mellitus with complication (HCC)   Relevant Orders   CBC   Comprehensive metabolic panel   Lipid panel   TSH   VITAMIN D 25  Hydroxy (Vit-D Deficiency, Fractures)   HgB A1c   Urine Microalbumin w/creat. ratio   Other Visit Diagnoses     Healthcare maintenance    -  Primary   Relevant Orders   CBC   Comprehensive metabolic panel   Lipid panel   TSH   VITAMIN D 25 Hydroxy (Vit-D Deficiency, Fractures)   HgB A1c   HIV Antibody (routine testing w rflx)   Urine Microalbumin w/creat. ratio   Vitamin D deficiency       Relevant Orders   VITAMIN D 25 Hydroxy (Vit-D Deficiency, Fractures)        Orders & Medications Medications:  Meds ordered this encounter  Medications   DISCONTD: azelastine (OPTIVAR) 0.05 % ophthalmic solution    Sig: Place 1 drop into both eyes 2 (two) times daily.    Dispense:  6 mL    Refill:  0   azelastine (OPTIVAR) 0.05 % ophthalmic solution    Sig: Place 1 drop into both eyes 2 (two) times daily.  Dispense:  6 mL    Refill:  0   Orders Placed This Encounter  Procedures   CBC   Comprehensive metabolic panel   Lipid panel   TSH   VITAMIN D 25 Hydroxy (Vit-D Deficiency, Fractures)   HgB A1c   HIV Antibody (routine testing w rflx)   Urine Microalbumin w/creat. ratio     Return in about 3 months (around 12/11/2022) for DM2 check.    Jerrol Banana, MD, [facility]   Primary Care Sports Medicine Primary Care and Sports Medicine at [facility]

## 2022-09-10 NOTE — Patient Instructions (Addendum)
-   Obtain fasting labs with orders provided (can have water or black coffee but otherwise no food or drink x 8 hours before labs) - Review information provided - Attend eye doctor annually, dentist every 6 months, work towards or maintain 30 minutes of moderate intensity physical activity at least 5 days per week, and consume a balanced diet - Return in 3 months - Contact us for any questions between now and then  Additionally: - Coordinator will contact you to schedule ultrasounds - Continue Flonase and Xyzal, Mucinex - Start Rx eyedrops, use OTC saline spray - Contact us next week if symptoms persist without improvement for next steps

## 2022-09-11 LAB — LIPID PANEL
Chol/HDL Ratio: 3.9 ratio (ref 0.0–5.0)
Cholesterol, Total: 159 mg/dL (ref 100–199)
HDL: 41 mg/dL (ref >39)
LDL Chol Calc (NIH): 98 mg/dL (ref 0–99)
Triglycerides: 111 mg/dL (ref 0–149)
VLDL Cholesterol Cal: 20 mg/dL (ref 5–40)

## 2022-09-11 LAB — CBC
Hematocrit: 44.2 % (ref 37.5–51.0)
Hemoglobin: 15.3 g/dL (ref 13.0–17.7)
MCH: 29.2 pg (ref 26.6–33.0)
MCHC: 34.6 g/dL (ref 31.5–35.7)
MCV: 84 fL (ref 79–97)
Platelets: 251 10*3/uL (ref 150–450)
RBC: 5.24 x10E6/uL (ref 4.14–5.80)
RDW: 12.4 % (ref 11.6–15.4)
WBC: 5.3 10*3/uL (ref 3.4–10.8)

## 2022-09-11 LAB — COMPREHENSIVE METABOLIC PANEL
ALT: 18 IU/L (ref 0–44)
AST: 22 IU/L (ref 0–40)
Albumin/Globulin Ratio: 1.5 (ref 1.2–2.2)
Albumin: 4.1 g/dL (ref 4.1–5.1)
Alkaline Phosphatase: 63 IU/L (ref 44–121)
BUN/Creatinine Ratio: 10 (ref 9–20)
BUN: 12 mg/dL (ref 6–24)
Bilirubin Total: 1.2 mg/dL (ref 0.0–1.2)
CO2: 26 mmol/L (ref 20–29)
Calcium: 9.3 mg/dL (ref 8.7–10.2)
Chloride: 100 mmol/L (ref 96–106)
Creatinine, Ser: 1.22 mg/dL (ref 0.76–1.27)
Globulin, Total: 2.7 g/dL (ref 1.5–4.5)
Glucose: 97 mg/dL (ref 70–99)
Potassium: 3.9 mmol/L (ref 3.5–5.2)
Sodium: 141 mmol/L (ref 134–144)
Total Protein: 6.8 g/dL (ref 6.0–8.5)
eGFR: 74 mL/min/{1.73_m2} (ref >59)

## 2022-09-11 LAB — HEMOGLOBIN A1C
Est. average glucose Bld gHb Est-mCnc: 103 mg/dL
Hgb A1c MFr Bld: 5.2 % (ref 4.8–5.6)

## 2022-09-11 LAB — MICROALBUMIN / CREATININE URINE RATIO
Creatinine, Urine: 177 mg/dL
Microalb/Creat Ratio: 5 mg/g creat (ref 0–29)
Microalbumin, Urine: 8.3 ug/mL

## 2022-09-11 LAB — VITAMIN D 25 HYDROXY (VIT D DEFICIENCY, FRACTURES): Vit D, 25-Hydroxy: 10.6 ng/mL — ABNORMAL LOW (ref 30.0–100.0)

## 2022-09-11 LAB — TSH: TSH: 2.51 u[IU]/mL (ref 0.450–4.500)

## 2022-09-11 LAB — HIV ANTIBODY (ROUTINE TESTING W REFLEX): HIV Screen 4th Generation wRfx: NONREACTIVE

## 2022-09-17 ENCOUNTER — Ambulatory Visit
Admission: RE | Admit: 2022-09-17 | Discharge: 2022-09-17 | Disposition: A | Discharge status: Home / Self Care | Payer: 59 | Source: Ambulatory Visit | Attending: Family Medicine | Admitting: Family Medicine

## 2022-09-17 ENCOUNTER — Other Ambulatory Visit: Payer: Self-pay | Admitting: Family Medicine

## 2022-09-17 DIAGNOSIS — N6323 Unspecified lump in the left breast, lower outer quadrant: Secondary | ICD-10-CM

## 2022-09-17 DIAGNOSIS — Z803 Family history of malignant neoplasm of breast: Secondary | ICD-10-CM

## 2022-09-17 DIAGNOSIS — E118 Type 2 diabetes mellitus with unspecified complications: Secondary | ICD-10-CM

## 2022-09-17 NOTE — Telephone Encounter (Signed)
Requested Prescriptions  Pending Prescriptions Disp Refills   OZEMPIC, 2 MG/DOSE, 8 MG/3ML SOPN [Pharmacy Med Name: OZEMPIC  2MG  SOLUTION PEN-INJECTOR  PEN DOSE] 9 mL 3    Sig: INJECT SUBCUTANEOUSLY 2 MG EVERY WEEK     Endocrinology:  Diabetes - GLP-1 Receptor Agonists - semaglutide Passed - 09/17/2022  5:06 AM      Passed - HBA1C in normal range and within 180 days    Hgb A1c MFr Bld  Date Value Ref Range Status  09/10/2022 5.2 4.8 - 5.6 % Final    Comment:             Prediabetes: 5.7 - 6.4          Diabetes: >6.4          Glycemic control for adults with diabetes: <7.0          Passed - Cr in normal range and within 360 days    Creatinine, Ser  Date Value Ref Range Status  09/10/2022 1.22 0.76 - 1.27 mg/dL Final         Passed - Valid encounter within last 6 months    Recent Outpatient Visits           1 week ago Healthcare maintenance   [place] [facility] at [facility], Ocie Bob, MD   3 months ago Pain of midfoot, right   [facility] at [facility], Ocie Bob, MD   3 months ago Primary hypertension   [place] [facility] at [facility], Ocie Bob, MD   6 months ago Posterior tibialis tendinitis of both lower extremities   [place] [facility] at [facility], Ocie Bob, MD   8 months ago Type 2 diabetes mellitus with complication [facility])   [place] Primary Care & Sports Medicine at [facility], Ocie Bob, MD       Future Appointments             In 2 months Ashley Royalty, Ocie Bob, MD [facility] at [facility], [facility]   In 12 months Ashley Royalty, Ocie Bob, MD [facility] Primary Care & Sports Medicine at [facility], [facility]

## 2022-09-23 ENCOUNTER — Other Ambulatory Visit: Payer: Self-pay | Admitting: Family Medicine

## 2022-09-23 MED ORDER — VITAMIN D (ERGOCALCIFEROL) 1.25 MG (50000 UNIT) PO CAPS
50000.0000 [IU] | ORAL_CAPSULE | ORAL | 0 refills | Status: DC
Start: 2022-09-23 — End: 2022-12-10

## 2022-10-14 ENCOUNTER — Other Ambulatory Visit: Payer: Self-pay | Admitting: Family Medicine

## 2022-10-15 NOTE — Telephone Encounter (Signed)
Requested medication (s) are due for refill today - yes  Requested medication (s) are on the active medication list -yes  Future visit scheduled -yes  Last refill: 09/10/22 6ml  Notes to clinic: original Rx written without refills - pharmacy requesting 90 day supply with RF- sent for review   Requested Prescriptions  Pending Prescriptions Disp Refills   azelastine (OPTIVAR) 0.05 % ophthalmic solution [Pharmacy Med Name: AZELASTINE HCL 0.05% DROPS] 18 mL 1    Sig: INSTILL 1 DROP INTO BOTH EYES TWICE A DAY     Ophthalmology:  Antiallergy Passed - 10/14/2022  9:30 AM      Passed - Valid encounter within last 12 months    Recent Outpatient Visits           1 month ago Healthcare maintenance   [place] [facility] at [facility], Ocie Bob, MD   4 months ago Pain of midfoot, right   [facility] Primary Care [facility] at [facility], Ocie Bob, MD   4 months ago Primary hypertension   [place] [facility] at [facility], Ocie Bob, MD   7 months ago Posterior tibialis tendinitis of both lower extremities   [place] [facility] at [facility], Ocie Bob, MD   9 months ago Type 2 diabetes mellitus with complication [facility])   [place] [facility] at [facility], Ocie Bob, MD       Future Appointments             In 1 month Ashley Royalty, Ocie Bob, MD [facility] Primary Care & Sports Medicine at [facility], [facility]   In 11 months Ashley Royalty, Ocie Bob, MD [facility] Primary Care & Sports Medicine at [facility], [facility]               Requested Prescriptions  Pending Prescriptions Disp Refills   azelastine (OPTIVAR) 0.05 % ophthalmic solution [Pharmacy Med Name: AZELASTINE HCL 0.05% DROPS] 18 mL 1    Sig: INSTILL 1 DROP INTO BOTH EYES TWICE A DAY     Ophthalmology:  Antiallergy Passed - 10/14/2022  9:30 AM      Passed - Valid  encounter within last 12 months    Recent Outpatient Visits           1 month ago Healthcare maintenance   [facility] Primary Care & Sports Medicine at [facility], Ocie Bob, MD   4 months ago Pain of midfoot, right   [facility] Primary Care & Sports Medicine at [facility], Ocie Bob, MD   4 months ago Primary hypertension   [place] [facility] at [facility], Ocie Bob, MD   7 months ago Posterior tibialis tendinitis of both lower extremities   [place] [facility] Medicine at [facility] Royalty, Ocie Bob, MD   9 months ago Type 2 diabetes mellitus with complication [facility])    Primary Care & Sports Medicine at [facility], Ocie Bob, MD       Future Appointments             In 1 month Ashley Royalty, Ocie Bob, MD [facility] Primary Care & Sports Medicine at [facility], [facility]   In 11 months Ashley Royalty, Ocie Bob, MD [facility] Primary Care & Sports Medicine at [facility], [facility]

## 2022-10-15 NOTE — Telephone Encounter (Signed)
Please review.  KP

## 2022-10-28 ENCOUNTER — Other Ambulatory Visit: Payer: Self-pay | Admitting: Family Medicine

## 2022-10-28 NOTE — Telephone Encounter (Signed)
Requested medication (s) are due for refill today: Yes  Requested medication (s) are on the active medication list: Yes  Last refill:  09/23/22  Future visit scheduled: Yes  Notes to clinic:  Unable to refill per protocol, cannot delegate.      Requested Prescriptions  Pending Prescriptions Disp Refills   Vitamin D, Ergocalciferol, (DRISDOL) 1.25 MG (50000 UNIT) CAPS capsule [Pharmacy Med Name: Vitamin D (Ergocalciferol) 1.25 MG (50000 UT) Oral Capsule] 8 capsule 5    Sig: TAKE 1 CAPSULE BY MOUTH EVERY 7  DAYS TAKE FOR 8 TOTAL  DOSES/WEEKS     Endocrinology:  Vitamins - Vitamin D Supplementation 2 Failed - 10/28/2022 11:58 AM      Failed - Manual Review: Route requests for 50,000 IU strength to the provider      Failed - Vitamin D in normal range and within 360 days    Vit D, 25-Hydroxy  Date Value Ref Range Status  09/10/2022 10.6 (L) 30.0 - 100.0 ng/mL Final    Comment:    Vitamin D deficiency has been defined by the Institute of Medicine and an Endocrine Society practice guideline as a level of serum 25-OH vitamin D less than 20 ng/mL (1,2). The Endocrine Society went on to further define vitamin D insufficiency as a level between 21 and 29 ng/mL (2). 1. IOM (Institute of Medicine). 2010. Dietary reference    intakes for calcium and D. [place]: The    [employer]. 2. Holick MF, Binkley [place], Bischoff-Ferrari HA, et al.    Evaluation, treatment, and prevention of vitamin D    deficiency: an Endocrine Society clinical practice    guideline. JCEM. 2011 Jul; 96(7):1911-30.          Passed - Ca in normal range and within 360 days    Calcium  Date Value Ref Range Status  09/10/2022 9.3 8.7 - 10.2 mg/dL Final         Passed - Valid encounter within last 12 months    Recent Outpatient Visits           1 month ago Healthcare maintenance   [place] [facility] at [facility], Ocie Bob, MD   4 months ago Pain of midfoot,  right   [facility] Primary [facility] at [facility], Ocie Bob, MD   4 months ago Primary hypertension   [place] [facility], Ocie Bob, MD   7 months ago Posterior tibialis tendinitis of both lower extremities   [place] [facility] at [facility], Ocie Bob, MD   10 months ago Type 2 diabetes mellitus with complication [facility])   [place] Primary Care & Sports Medicine at [facility], Ocie Bob, MD       Future Appointments             In 1 month Ashley Royalty, Ocie Bob, MD [facility] Primary Care & Sports Medicine at [facility], [facility]   In 10 months Ashley Royalty, Ocie Bob, MD [facility] Health Primary Care & Sports Medicine at [facility], [facility]            C

## 2022-11-19 ENCOUNTER — Other Ambulatory Visit: Payer: Self-pay | Admitting: Family Medicine

## 2022-11-19 ENCOUNTER — Encounter: Payer: Self-pay | Admitting: Family Medicine

## 2022-11-21 ENCOUNTER — Encounter: Payer: Self-pay | Admitting: Family Medicine

## 2022-11-24 ENCOUNTER — Other Ambulatory Visit: Payer: Self-pay

## 2022-11-24 DIAGNOSIS — E118 Type 2 diabetes mellitus with unspecified complications: Secondary | ICD-10-CM

## 2022-11-24 DIAGNOSIS — I10 Essential (primary) hypertension: Secondary | ICD-10-CM

## 2022-11-24 MED ORDER — METFORMIN HCL ER 500 MG PO TB24
500.0000 mg | ORAL_TABLET | Freq: Every day | ORAL | 0 refills | Status: DC
Start: 2022-11-24 — End: 2023-01-07

## 2022-11-24 MED ORDER — METOPROLOL SUCCINATE ER 25 MG PO TB24
25.0000 mg | ORAL_TABLET | Freq: Every day | ORAL | 0 refills | Status: DC
Start: 2022-11-24 — End: 2023-04-13

## 2022-11-24 MED ORDER — LOSARTAN POTASSIUM-HCTZ 100-25 MG PO TABS
1.0000 | ORAL_TABLET | Freq: Every day | ORAL | 0 refills | Status: DC
Start: 2022-11-24 — End: 2023-04-13

## 2022-11-24 NOTE — Telephone Encounter (Signed)
Please review.  KP

## 2022-11-25 ENCOUNTER — Other Ambulatory Visit: Payer: Self-pay | Admitting: Family Medicine

## 2022-11-25 DIAGNOSIS — E118 Type 2 diabetes mellitus with unspecified complications: Secondary | ICD-10-CM

## 2022-11-25 DIAGNOSIS — I10 Essential (primary) hypertension: Secondary | ICD-10-CM

## 2022-11-27 NOTE — Telephone Encounter (Signed)
Unable to refill per protocol, Rx request is too soon. Last refill 11/24/22.  Requested Prescriptions  Pending Prescriptions Disp Refills   metFORMIN (GLUCOPHAGE-XR) 500 MG 24 hr tablet [Pharmacy Med Name: METFORMIN HCL ER 500 MG TABLET] 90 tablet 1    Sig: TAKE 1 TABLET BY MOUTH EVERY DAY WITH BREAKFAST     Endocrinology:  Diabetes - Biguanides Failed - 11/25/2022  8:59 PM      Failed - CBC within normal limits and completed in the last 12 months    WBC  Date Value Ref Range Status  09/10/2022 5.3 3.4 - 10.8 x10E3/uL Final   RBC  Date Value Ref Range Status  09/10/2022 5.24 4.14 - 5.80 x10E6/uL Final   Hemoglobin  Date Value Ref Range Status  09/10/2022 15.3 13.0 - 17.7 g/dL Final   Hematocrit  Date Value Ref Range Status  09/10/2022 44.2 37.5 - 51.0 % Final   MCHC  Date Value Ref Range Status  09/10/2022 34.6 31.5 - 35.7 g/dL Final   [facility]  Date Value Ref Range Status  09/10/2022 29.2 26.6 - 33.0 pg Final   MCV  Date Value Ref Range Status  09/10/2022 84 79 - 97 fL Final   No results found for: "PLTCOUNTKUC", "LABPLAT", "POCPLA" RDW  Date Value Ref Range Status  09/10/2022 12.4 11.6 - 15.4 % Final         Passed - Cr in normal range and within 360 days    Creatinine, Ser  Date Value Ref Range Status  09/10/2022 1.22 0.76 - 1.27 mg/dL Final         Passed - HBA1C is between 0 and 7.9 and within 180 days    Hgb A1c MFr Bld  Date Value Ref Range Status  09/10/2022 5.2 4.8 - 5.6 % Final    Comment:             Prediabetes: 5.7 - 6.4          Diabetes: >6.4          Glycemic control for adults with diabetes: <7.0          Passed - eGFR in normal range and within 360 days    eGFR  Date Value Ref Range Status  09/10/2022 74 >59 mL/min/1.73 Final         Passed - B12 Level in normal range and within 720 days    Vitamin B-12  Date Value Ref Range Status  05/30/2021 462 232 - 1,245 pg/mL Final         Passed - Valid encounter within last 6 months     Recent Outpatient Visits           2 months ago Healthcare maintenance   [place] [facility] at [facility], Ocie Bob, MD   5 months ago Pain of midfoot, right   [facility] at [facility], Ocie Bob, MD   5 months ago Primary hypertension   [place] [facility], Ocie Bob, MD   8 months ago Posterior tibialis tendinitis of both lower extremities   [place] Primary Care & Sports Medicine at [facility] Ashley Royalty, Ocie Bob, MD   11 months ago Type 2 diabetes mellitus with complication [facility])   [place] Primary Care & Sports Medicine at [facility], Ocie Bob, MD       Future Appointments  In 1 week Ashley Royalty, Ocie Bob, MD [facility] Primary Care & Sports Medicine at [facility], [facility]   In 9 months Ashley Royalty, Ocie Bob, MD [facility] Primary Care & Sports Medicine at [facility], [facility]             losartan-hydrochlorothiazide (HYZAAR) 100-25 MG tablet [Pharmacy Med Name: LOSARTAN-HCTZ 100-25 MG TAB] 90 tablet 1    Sig: TAKE 1 TABLET BY MOUTH EVERY DAY     Cardiovascular: ARB + Diuretic Combos Passed - 11/25/2022  8:59 PM      Passed - K in normal range and within 180 days    Potassium  Date Value Ref Range Status  09/10/2022 3.9 3.5 - 5.2 mmol/L Final         Passed - Na in normal range and within 180 days    Sodium  Date Value Ref Range Status  09/10/2022 141 134 - 144 mmol/L Final         Passed - Cr in normal range and within 180 days    Creatinine, Ser  Date Value Ref Range Status  09/10/2022 1.22 0.76 - 1.27 mg/dL Final         Passed - eGFR is 10 or above and within 180 days    eGFR  Date Value Ref Range Status  09/10/2022 74 >59 mL/min/1.73 Final         Passed - Patient is not pregnant      Passed - Last BP in normal range    BP Readings from Last 1 Encounters:  09/10/22 128/78          Passed - Valid encounter within last 6 months    Recent Outpatient Visits           2 months ago Healthcare maintenance   [facility] Primary Care & Sports Medicine at [facility], Ocie Bob, MD   5 months ago Pain of midfoot, right   [facility] Primary Care & Sports Medicine at [facility], Ocie Bob, MD   5 months ago Primary hypertension   [place] [facility] at [facility], Ocie Bob, MD   8 months ago Posterior tibialis tendinitis of both lower extremities   [place] Primary Care & Sports Medicine at [facility] Ashley Royalty, Ocie Bob, MD   11 months ago Type 2 diabetes mellitus with complication [facility])   [place] Primary Care & Sports Medicine at [facility], Ocie Bob, MD       Future Appointments             In 1 week Ashley Royalty, Ocie Bob, MD [facility] Health Primary Care & Sports Medicine at [facility], [facility]   In 9 months Ashley Royalty, Ocie Bob, MD [facility] Primary Care & Sports Medicine at [facility], [facility]             metoprolol succinate (TOPROL-XL) 25 MG 24 hr tablet [Pharmacy Med Name: METOPROLOL SUCC ER 25 MG TAB] 90 tablet 1    Sig: TAKE 1 TABLET (25 MG TOTAL) BY MOUTH DAILY.     Cardiovascular:  Beta Blockers Passed - 11/25/2022  8:59 PM      Passed - Last BP in normal range    BP Readings from Last 1 Encounters:  09/10/22 128/78         Passed - Last Heart Rate in normal range    Pulse Readings from Last 1 Encounters:  09/10/22 72  Passed - Valid encounter within last 6 months    Recent Outpatient Visits           2 months ago Healthcare maintenance   [place] [facility], Ocie Bob, MD   5 months ago Pain of midfoot, right   [facility] Primary Care & Sports Medicine at [facility], Ocie Bob, MD   5 months ago Primary hypertension   [place] [facility] at [facility],  Ocie Bob, MD   8 months ago Posterior tibialis tendinitis of both lower extremities   [place] [facility] Care & Sports Medicine at [facility] Royalty, Ocie Bob, MD   11 months ago Type 2 diabetes mellitus with complication [facility])   [place] Primary Care & Sports Medicine at [facility], Ocie Bob, MD       Future Appointments             In 1 week Ashley Royalty, Ocie Bob, MD [facility] Primary Care & Sports Medicine at [facility], [facility]   In 9 months Ashley Royalty, Ocie Bob, MD [facility] Primary Care & Sports Medicine at [facility], [facility]

## 2022-12-10 ENCOUNTER — Encounter: Payer: Self-pay | Admitting: Family Medicine

## 2022-12-10 ENCOUNTER — Ambulatory Visit (INDEPENDENT_AMBULATORY_CARE_PROVIDER_SITE_OTHER): Payer: No Typology Code available for payment source | Admitting: Family Medicine

## 2022-12-10 VITALS — BP 148/90 | HR 79 | Ht 73.0 in | Wt 298.0 lb

## 2022-12-10 DIAGNOSIS — E662 Morbid (severe) obesity with alveolar hypoventilation: Secondary | ICD-10-CM | POA: Diagnosis not present

## 2022-12-10 DIAGNOSIS — Z6839 Body mass index (BMI) 39.0-39.9, adult: Secondary | ICD-10-CM

## 2022-12-10 DIAGNOSIS — I10 Essential (primary) hypertension: Secondary | ICD-10-CM | POA: Diagnosis not present

## 2022-12-10 DIAGNOSIS — E118 Type 2 diabetes mellitus with unspecified complications: Secondary | ICD-10-CM | POA: Diagnosis not present

## 2022-12-10 DIAGNOSIS — G4733 Obstructive sleep apnea (adult) (pediatric): Secondary | ICD-10-CM

## 2022-12-10 DIAGNOSIS — M79674 Pain in right toe(s): Secondary | ICD-10-CM | POA: Insufficient documentation

## 2022-12-10 LAB — POCT GLYCOSYLATED HEMOGLOBIN (HGB A1C): Hemoglobin A1C: 4.8 % (ref 4.0–5.6)

## 2022-12-10 NOTE — Assessment & Plan Note (Signed)
Has been mostly compliant, some expected travel-related decrease in compliance.

## 2022-12-10 NOTE — Progress Notes (Signed)
Poc4

## 2022-12-10 NOTE — Assessment & Plan Note (Signed)
Elevated readings noted despite recheck. Has been compliant with medications, took his medication just prior to visit, denies cardiopulmonary complaints.  - Will track readings at close follow-up in 3 months - Encourage healthy lifestyle changes over interim

## 2022-12-10 NOTE — Patient Instructions (Signed)
-   Work on diet and lifestyle (return to your healthy baseline) after travel commitments complete - Work on regular CPAP usage as you had been - Use supportive care (buddy tape, firm-soled shoes, pain control) for pinky toe - Can contact if toe still symptomatic after a few weeks to discuss next steps - Return in 3 months

## 2022-12-10 NOTE — Assessment & Plan Note (Signed)
Acute, traumatic (popped after striking couch yesterday). Exam without ecchymosis, sensorimotor intact, he is tender at the digit alone, not at MTP or proximal.  - Supportive care (buddy tape, firm-soled shoes, pain control) - Can contact if still symptomatic after a few weeks for next steps - Can consider x-rays, immobilization at that time

## 2022-12-10 NOTE — Assessment & Plan Note (Signed)
Patient does continue to show steady weight loss. Does attribute a degree of weight loss plateau to high recent travel demands and associated change in his diet and exercise routines.  - Track this issue at return - Can consider transition to [facility] for weight loss adjunct if persistent plateau noted despite return to baseline activity/diet

## 2022-12-10 NOTE — Assessment & Plan Note (Signed)
Doing very well, recent A1c demonstrates excellent 3 month glycemic control

## 2022-12-10 NOTE — Progress Notes (Signed)
     Primary Care / Sports Medicine Office Visit  Patient Information:  Patient ID: Gavynn Pole, male DOB: 17-Jun-1975 Age: 47 y.o. MRN: [id number]   Shan Figler is a pleasant 47 y.o. male presenting with the following:  Chief Complaint  Patient presents with   Diabetes   Toe Pain    Patient hit right pinky toe on cough yesterday. Swollen and painful.    Vitals:   12/10/22 0844 12/10/22 0904  BP: (!) 152/94 (!) 148/90  Pulse: 79   SpO2: 97%    Vitals:   12/10/22 0844  Weight: 298 lb (135.2 kg)  Height: 6\' 1"  (1.854 m)   Body mass index is 39.32 kg/m.  No results found.   Independent interpretation of notes and tests performed by another provider:   None  Procedures performed:   None  Pertinent History, Exam, Impression, and Recommendations:   Chambers was seen today for diabetes and toe pain.  Type 2 diabetes mellitus with complication (HCC) Assessment & Plan: Doing very well, recent A1c demonstrates excellent 3 month glycemic control  Orders: -     POCT glycosylated hemoglobin (Hb A1C)  Primary hypertension Assessment & Plan: Elevated readings noted despite recheck. Has been compliant with medications, took his medication just prior to visit, denies cardiopulmonary complaints.  - Will track readings at close follow-up in 3 months - Encourage healthy lifestyle changes over interim   Class 2 obesity with alveolar hypoventilation, serious comorbidity, and body mass index (BMI) of 39.0 to 39.9 in adult [facility]) Assessment & Plan: Patient does continue to show steady weight loss. Does attribute a degree of weight loss plateau to high recent travel demands and associated change in his diet and exercise routines.  - Track this issue at return - Can consider transition to [facility] for weight loss adjunct if persistent plateau noted despite return to baseline activity/diet   OSA on CPAP Assessment & Plan: Has been mostly compliant, some expected  travel-related decrease in compliance.   Toe pain, right Assessment & Plan: Acute, traumatic (popped after striking couch yesterday). Exam without ecchymosis, sensorimotor intact, he is tender at the digit alone, not at MTP or proximal.  - Supportive care (buddy tape, firm-soled shoes, pain control) - Can contact if still symptomatic after a few weeks for next steps - Can consider x-rays, immobilization at that time      Orders & Medications No orders of the defined types were placed in this encounter.  Orders Placed This Encounter  Procedures   POCT glycosylated hemoglobin (Hb A1C)     No follow-ups on file.     Jerrol Banana, MD, [facility]   Primary Care Sports Medicine Primary Care and Sports Medicine at [facility]  s

## 2022-12-15 ENCOUNTER — Encounter: Payer: Self-pay | Admitting: Family Medicine

## 2022-12-16 ENCOUNTER — Ambulatory Visit (INDEPENDENT_AMBULATORY_CARE_PROVIDER_SITE_OTHER): Payer: No Typology Code available for payment source | Admitting: Family Medicine

## 2022-12-16 ENCOUNTER — Encounter: Payer: Self-pay | Admitting: Family Medicine

## 2022-12-16 VITALS — BP 150/100 | HR 60 | Ht 73.0 in | Wt 299.0 lb

## 2022-12-16 DIAGNOSIS — M7672 Peroneal tendinitis, left leg: Secondary | ICD-10-CM

## 2022-12-16 MED ORDER — HYDROCODONE-ACETAMINOPHEN 5-300 MG PO TABS
1.0000 | ORAL_TABLET | Freq: Three times a day (TID) | ORAL | 0 refills | Status: AC
Start: 2022-12-16 — End: 2022-12-21

## 2022-12-16 MED ORDER — HYDROCODONE-ACETAMINOPHEN 5-325 MG PO TABS: 1.0000 | ORAL_TABLET | Freq: Three times a day (TID) | ORAL | 0 refills | Status: DC | PRN

## 2022-12-16 MED ORDER — PREDNISONE 50 MG PO TABS: 50.0000 mg | ORAL_TABLET | Freq: Every day | ORAL | 0 refills | Status: DC

## 2022-12-16 MED ORDER — DICLOFENAC SODIUM 75 MG PO TBEC: 75.0000 mg | DELAYED_RELEASE_TABLET | Freq: Two times a day (BID) | ORAL | 0 refills | Status: DC

## 2022-12-16 NOTE — Assessment & Plan Note (Signed)
Patient presents with atraumatic, several-day history of lateral left midfoot pain, severe, radiating proximally. At onset had tried a new pair of Crocs.   Exam shows lateral midfoot swelling, no ecchymosis, tenderness at the base of the 5th maximally with tenderness tracking along the distal peroneals, no subluxation  Plan: - Dose prednisone for full course - After prednisone completed, can use diclofenac (NSAID) twice daily as-needed - Use lace-up ankle brace when on your feet and keep foot elevated when off of your foot - Can use hydrocodone-acetaminophen (Vicodin) for severe, breakthrough pain not responding to the above - Once symptoms resolve, wean from brace, and start exercises - Contact us for any persistent pain despite the above - Can consider advanced imaging, PT, referral to podiatry as next step options

## 2022-12-16 NOTE — Patient Instructions (Signed)
-   Dose prednisone for full course - After prednisone completed, can use diclofenac (NSAID) twice daily as-needed - Use lace-up ankle brace when on your feet and keep foot elevated when off of your foot - Can use hydrocodone-acetaminophen (Vicodin) for severe, breakthrough pain not responding to the above - Once symptoms resolve, wean from brace, and start exercises - Contact us for any persistent pain despite the above

## 2022-12-16 NOTE — Progress Notes (Signed)
     Primary Care / Sports Medicine Office Visit  Patient Information:  Patient ID: Travis Petty, male DOB: 25-Dec-1975 Age: 47 y.o. MRN: [id number]   Travis Petty is a pleasant 47 y.o. male presenting with the following:  Chief Complaint  Patient presents with   Foot Pain    Left, feels like last time    Vitals:   12/16/22 1546  BP: (!) 150/100  Pulse: 60   Vitals:   12/16/22 1546  Weight: 299 lb (135.6 kg)  Height: 6\' 1"  (1.854 m)   Body mass index is 39.45 kg/m.  No results found.   Independent interpretation of notes and tests performed by another provider:   None  Procedures performed:   None  Pertinent History, Exam, Impression, and Recommendations:   [name] was seen today for foot pain.  Peroneal tendonitis of left lower leg Assessment & Plan: Patient presents with atraumatic, several-day history of lateral left midfoot pain, severe, radiating proximally. At onset had tried a new pair of Crocs.   Exam shows lateral midfoot swelling, no ecchymosis, tenderness at the base of the 5th maximally with tenderness tracking along the distal peroneals, no subluxation  Plan: - Dose prednisone for full course - After prednisone completed, can use diclofenac (NSAID) twice daily as-needed - Use lace-up ankle brace when on your feet and keep foot elevated when off of your foot - Can use hydrocodone-acetaminophen (Vicodin) for severe, breakthrough pain not responding to the above - Once symptoms resolve, wean from brace, and start exercises - Contact us for any persistent pain despite the above - Can consider advanced imaging, PT, referral to podiatry as next step options  Orders: -     predniSONE; Take 1 tablet (50 mg total) by mouth daily.  Dispense: 5 tablet; Refill: 0 -     Diclofenac Sodium; Take 1 tablet (75 mg total) by mouth 2 (two) times daily.  Dispense: 30 tablet; Refill: 0 -     HYDROcodone-Acetaminophen; Take 1 tablet by mouth every 8 (eight) hours  for 5 days.  Dispense: 15 tablet; Refill: 0     Orders & Medications Meds ordered this encounter  Medications   predniSONE (DELTASONE) 50 MG tablet    Sig: Take 1 tablet (50 mg total) by mouth daily.    Dispense:  5 tablet    Refill:  0   DISCONTD: HYDROcodone-acetaminophen (NORCO/VICODIN) 5-325 MG tablet    Sig: Take 1 tablet by mouth every 8 (eight) hours as needed for moderate pain.    Dispense:  15 tablet    Refill:  0   diclofenac (VOLTAREN) 75 MG EC tablet    Sig: Take 1 tablet (75 mg total) by mouth 2 (two) times daily.    Dispense:  30 tablet    Refill:  0   HYDROcodone-Acetaminophen 5-300 MG TABS    Sig: Take 1 tablet by mouth every 8 (eight) hours for 5 days.    Dispense:  15 tablet    Refill:  0   No orders of the defined types were placed in this encounter.    No follow-ups on file.     Jerrol Banana, MD, [facility]   Primary Care Sports Medicine Primary Care and Sports Medicine at [facility]

## 2023-01-05 ENCOUNTER — Other Ambulatory Visit: Payer: Self-pay | Admitting: Family Medicine

## 2023-01-05 DIAGNOSIS — E118 Type 2 diabetes mellitus with unspecified complications: Secondary | ICD-10-CM

## 2023-01-07 NOTE — Telephone Encounter (Signed)
Requested Prescriptions  Pending Prescriptions Disp Refills   metFORMIN (GLUCOPHAGE-XR) 500 MG 24 hr tablet [Pharmacy Med Name: metFORMIN HCl ER 500 MG Oral Tablet Extended Release 24 Hour] 90 tablet 3    Sig: TAKE 1 TABLET BY MOUTH DAILY  WITH BREAKFAST     Endocrinology:  Diabetes - Biguanides Failed - 01/05/2023  6:26 AM      Failed - CBC within normal limits and completed in the last 12 months    WBC  Date Value Ref Range Status  09/10/2022 5.3 3.4 - 10.8 x10E3/uL Final   RBC  Date Value Ref Range Status  09/10/2022 5.24 4.14 - 5.80 x10E6/uL Final   Hemoglobin  Date Value Ref Range Status  09/10/2022 15.3 13.0 - 17.7 g/dL Final   Hematocrit  Date Value Ref Range Status  09/10/2022 44.2 37.5 - 51.0 % Final   MCHC  Date Value Ref Range Status  09/10/2022 34.6 31.5 - 35.7 g/dL Final   [facility]  Date Value Ref Range Status  09/10/2022 29.2 26.6 - 33.0 pg Final   MCV  Date Value Ref Range Status  09/10/2022 84 79 - 97 fL Final   No results found for: "PLTCOUNTKUC", "LABPLAT", "POCPLA" RDW  Date Value Ref Range Status  09/10/2022 12.4 11.6 - 15.4 % Final         Passed - Cr in normal range and within 360 days    Creatinine, Ser  Date Value Ref Range Status  09/10/2022 1.22 0.76 - 1.27 mg/dL Final         Passed - HBA1C is between 0 and 7.9 and within 180 days    Hemoglobin A1C  Date Value Ref Range Status  12/10/2022 4.8 4.0 - 5.6 % Final   Hgb A1c MFr Bld  Date Value Ref Range Status  09/10/2022 5.2 4.8 - 5.6 % Final    Comment:             Prediabetes: 5.7 - 6.4          Diabetes: >6.4          Glycemic control for adults with diabetes: <7.0          Passed - eGFR in normal range and within 360 days    eGFR  Date Value Ref Range Status  09/10/2022 74 >59 mL/min/1.73 Final         Passed - B12 Level in normal range and within 720 days    Vitamin B-12  Date Value Ref Range Status  05/30/2021 462 232 - 1,245 pg/mL Final         Passed - Valid  encounter within last 6 months    Recent Outpatient Visits           3 weeks ago Peroneal tendonitis of left lower leg   [place] [facility] at [facility] Royalty, Ocie Bob, MD   4 weeks ago Type 2 diabetes mellitus with complication [facility])   [place] [facility] at [facility], Ocie Bob, MD   3 months ago Healthcare maintenance   [facility] Primary Care & Sports Medicine at [facility], Ocie Bob, MD   6 months ago Pain of midfoot, right   [facility] Primary Care & Sports Medicine at [facility], Ocie Bob, MD   7 months ago Primary hypertension   [facility] Primary Care & Sports Medicine at [facility], Ocie Bob, MD  Future Appointments             In 2 months Ashley Royalty, Ocie Bob, MD [facility] Health Primary Care & Sports Medicine at [facility], [facility]   In 8 months Ashley Royalty, Ocie Bob, MD [facility] Primary Care & Sports Medicine at [facility], [facility]

## 2023-02-07 IMAGING — CR DG LUMBAR SPINE COMPLETE 4+V
5 series · 5 of 5 positions shown · non-contrast
Comparison: None.

CLINICAL DATA: Chronic back pain with left lower extremity
paresthesias.

EXAM:
LUMBAR SPINE - COMPLETE 4+ VIEW

[l-spine ap]
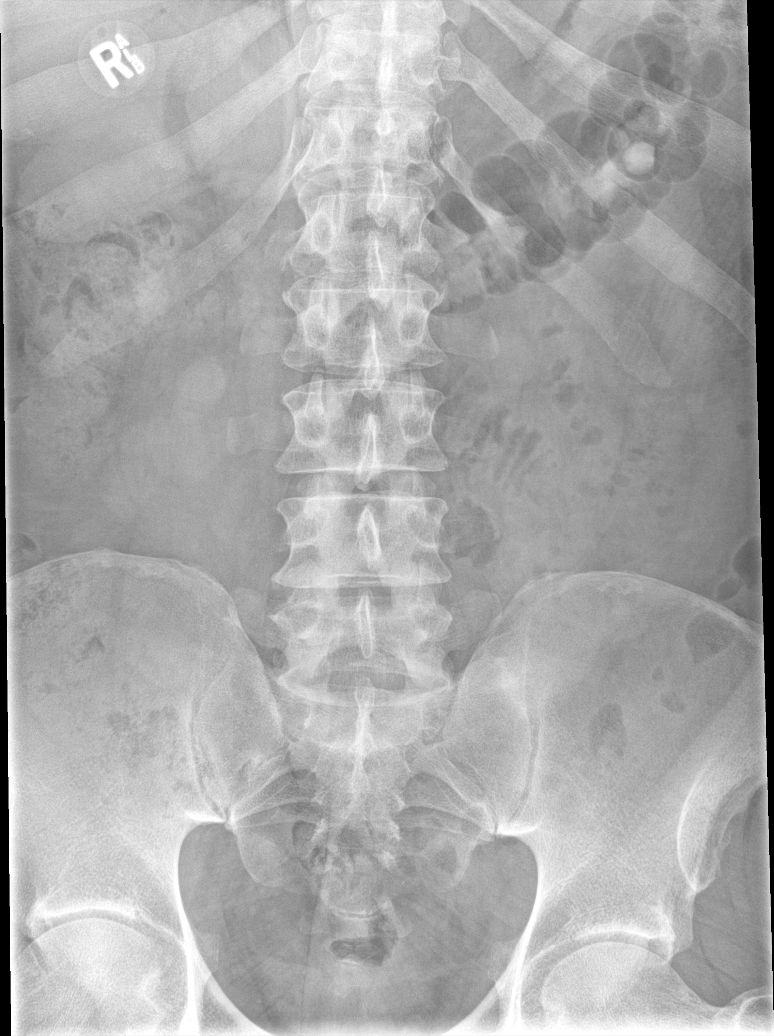

[l-spine obl (1 of 2)]
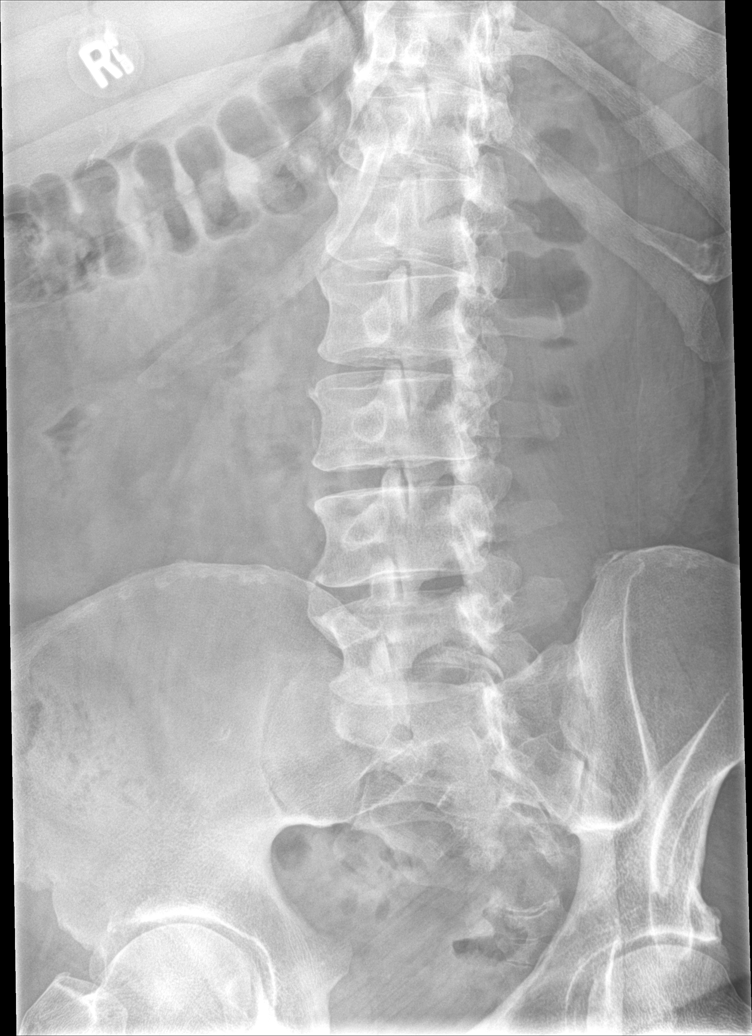

[l-spine obl (2 of 2)]
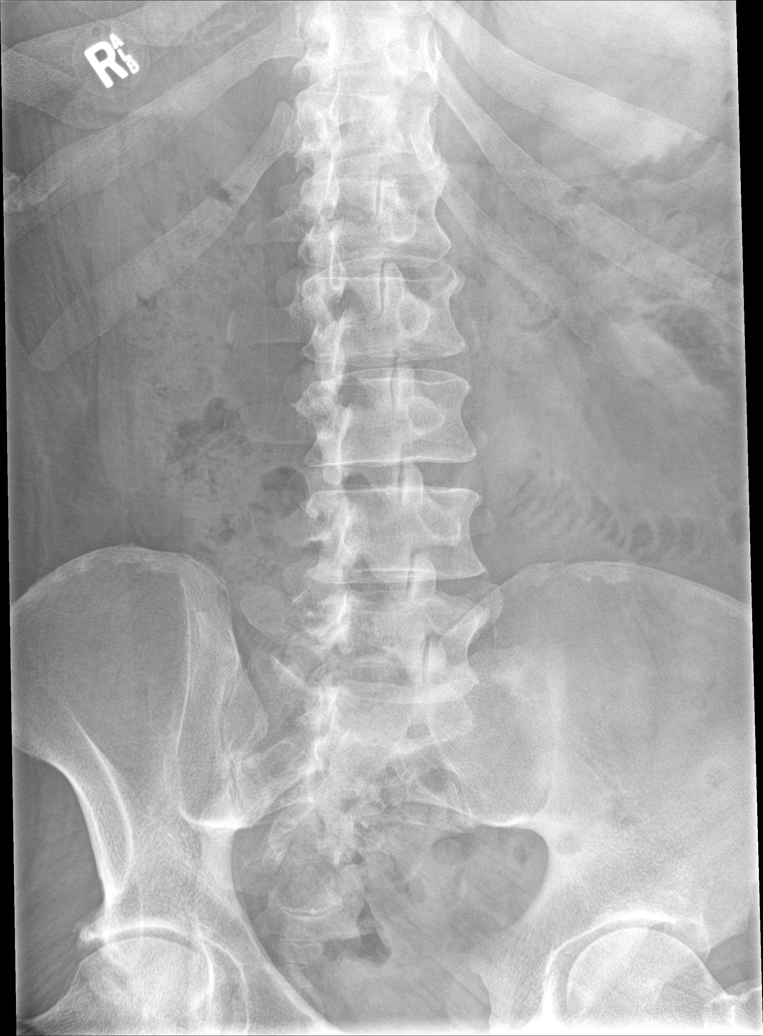

[l-spine lat]
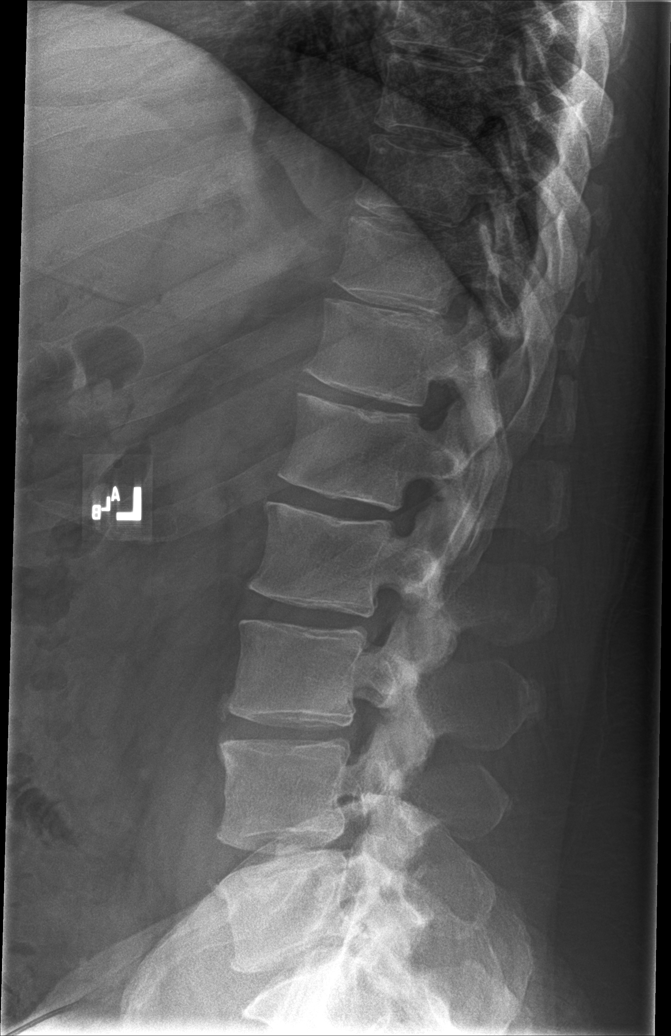

[l-spine spot]
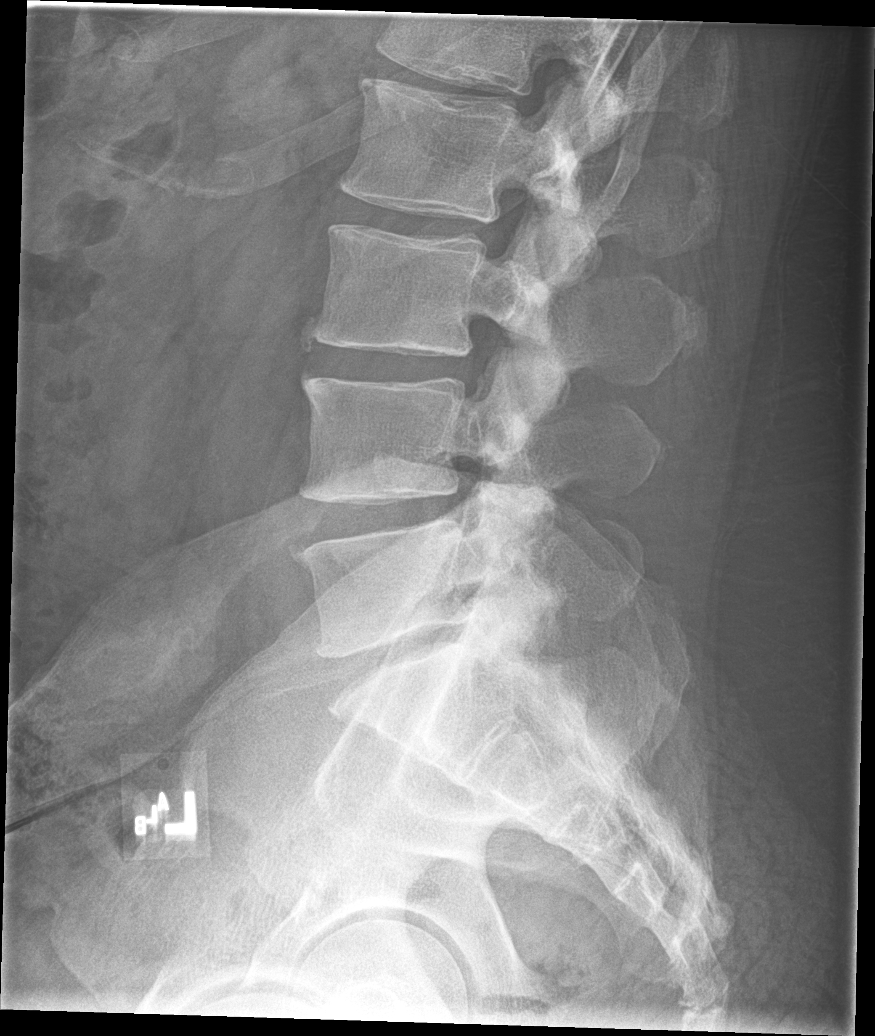

[5 of 5 positions shown; findings below may reference images not displayed]

FINDINGS: There is no evidence of lumbar spine fracture. Alignment is normal.
The vertebra are normal in heights. There is normal bone
mineralization.

There is mild lumbar marginal osteophytosis. Mild disc space loss is
noted at T12-L1, L1-2 and L5-S1 with normal disc heights from L2-3
through L4-5, and mild facet joint spurring is noted from L2-3 down.

On the oblique views there is no appreciable critical foraminal
stenosis but MRI would be more sensitive in detecting this.

There is ankylosis across the superior left SI joint. The right SI
joint appears patent. Visualized portions of the pelvis are
unremarkable.
IMPRESSION: Degenerative changes with mild spondylosis and facet joint spurring,
and 3 levels show mild disc space loss. No spinal compression
fracture.

## 2023-03-11 ENCOUNTER — Ambulatory Visit: Payer: No Typology Code available for payment source | Admitting: Family Medicine

## 2023-04-07 ENCOUNTER — Other Ambulatory Visit: Payer: Self-pay | Admitting: Family Medicine

## 2023-04-07 DIAGNOSIS — I10 Essential (primary) hypertension: Secondary | ICD-10-CM

## 2023-04-07 DIAGNOSIS — E118 Type 2 diabetes mellitus with unspecified complications: Secondary | ICD-10-CM

## 2023-04-09 NOTE — Telephone Encounter (Signed)
Requested medication (s) are due for refill today:   Yes for all 3  Requested medication (s) are on the active medication list:   Yes for all 3  Future visit scheduled:   Yes  09/16/2023   Last ordered: Metformin 01/07/2023 #90, 0 refills;   Metoprolol 11/24/2022 #30, 0 refills;   Hyzaar 11/24/2022 #30, 0 refills  Unable to refill because labs are due per protocol.      Requested Prescriptions  Pending Prescriptions Disp Refills   metFORMIN (GLUCOPHAGE-XR) 500 MG 24 hr tablet [Pharmacy Med Name: metFORMIN HCl ER 500 MG Oral Tablet Extended Release 24 Hour] 90 tablet 3    Sig: TAKE 1 TABLET BY MOUTH DAILY  WITH BREAKFAST     Endocrinology:  Diabetes - Biguanides Failed - 04/07/2023  4:28 AM      Failed - CBC within normal limits and completed in the last 12 months    WBC  Date Value Ref Range Status  09/10/2022 5.3 3.4 - 10.8 x10E3/uL Final   RBC  Date Value Ref Range Status  09/10/2022 5.24 4.14 - 5.80 x10E6/uL Final   Hemoglobin  Date Value Ref Range Status  09/10/2022 15.3 13.0 - 17.7 g/dL Final   Hematocrit  Date Value Ref Range Status  09/10/2022 44.2 37.5 - 51.0 % Final   MCHC  Date Value Ref Range Status  09/10/2022 34.6 31.5 - 35.7 g/dL Final   [facility]  Date Value Ref Range Status  09/10/2022 29.2 26.6 - 33.0 pg Final   MCV  Date Value Ref Range Status  09/10/2022 84 79 - 97 fL Final   No results found for: "PLTCOUNTKUC", "LABPLAT", "POCPLA" RDW  Date Value Ref Range Status  09/10/2022 12.4 11.6 - 15.4 % Final         Passed - Cr in normal range and within 360 days    Creatinine, Ser  Date Value Ref Range Status  09/10/2022 1.22 0.76 - 1.27 mg/dL Final         Passed - HBA1C is between 0 and 7.9 and within 180 days    Hemoglobin A1C  Date Value Ref Range Status  12/10/2022 4.8 4.0 - 5.6 % Final   Hgb A1c MFr Bld  Date Value Ref Range Status  09/10/2022 5.2 4.8 - 5.6 % Final    Comment:             Prediabetes: 5.7 - 6.4          Diabetes: >6.4           Glycemic control for adults with diabetes: <7.0          Passed - eGFR in normal range and within 360 days    eGFR  Date Value Ref Range Status  09/10/2022 74 >59 mL/min/1.73 Final         Passed - B12 Level in normal range and within 720 days    Vitamin B-12  Date Value Ref Range Status  05/30/2021 462 232 - 1,245 pg/mL Final         Passed - Valid encounter within last 6 months    Recent Outpatient Visits           3 months ago Peroneal tendonitis of left lower leg   [place] [facility] at [facility] Ashley Royalty, Ocie Bob, MD   4 months ago Type 2 diabetes mellitus with complication [facility])   [place] Primary Care & Sports Medicine at [facility] Ashley Royalty, Barbara Cower  J, MD   7 months ago Healthcare maintenance   [facility] Primary Care & Sports Medicine at [facility], Ocie Bob, MD   9 months ago Pain of midfoot, right   [facility] Primary Care & Sports Medicine at [facility], Ocie Bob, MD   10 months ago Primary hypertension   [place] Primary Care & Sports Medicine at [facility], Ocie Bob, MD       Future Appointments             In 5 months Ashley Royalty, Ocie Bob, MD [facility] Health Primary Care & Sports Medicine at [facility], [facility]             metoprolol succinate (TOPROL-XL) 25 MG 24 hr tablet [Pharmacy Med Name: Metoprolol Succinate ER 25 MG Oral Tablet Extended Release 24 Hour] 90 tablet 3    Sig: TAKE 1 TABLET BY MOUTH DAILY     Cardiovascular:  Beta Blockers Failed - 04/07/2023  4:28 AM      Failed - Last BP in normal range    BP Readings from Last 1 Encounters:  12/16/22 (!) 150/100         Passed - Last Heart Rate in normal range    Pulse Readings from Last 1 Encounters:  12/16/22 60         Passed - Valid encounter within last 6 months    Recent Outpatient Visits           3 months ago Peroneal tendonitis of left lower leg   [place] [facility] & [facility] Medicine at  [facility], Ocie Bob, MD   4 months ago Type 2 diabetes mellitus with complication [facility])   [place] Primary Care & Sports Medicine at [facility], Ocie Bob, MD   7 months ago Healthcare maintenance   [facility] Primary Care & Sports Medicine at [facility], Ocie Bob, MD   9 months ago Pain of midfoot, right   [facility] Primary Care & Sports Medicine at [facility], Ocie Bob, MD   10 months ago Primary hypertension   [place] Primary Care & Sports Medicine at [facility], Ocie Bob, MD       Future Appointments             In 5 months Ashley Royalty, Ocie Bob, MD [facility] Health Primary Care & Sports Medicine at [facility], [facility]             losartan-hydrochlorothiazide (HYZAAR) 100-25 MG tablet [Pharmacy Med Name: Losartan Potassium-HCTZ 100-25 MG Oral Tablet] 90 tablet 3    Sig: Take 1 tablet by mouth daily.     Cardiovascular: ARB + Diuretic Combos Failed - 04/07/2023  4:28 AM      Failed - K in normal range and within 180 days    Potassium  Date Value Ref Range Status  09/10/2022 3.9 3.5 - 5.2 mmol/L Final         Failed - Na in normal range and within 180 days    Sodium  Date Value Ref Range Status  09/10/2022 141 134 - 144 mmol/L Final         Failed - Cr in normal range and within 180 days    Creatinine, Ser  Date Value Ref Range Status  09/10/2022 1.22 0.76 - 1.27 mg/dL Final         Failed - eGFR is 10 or above and within 180 days  eGFR  Date Value Ref Range Status  09/10/2022 74 >59 mL/min/1.73 Final         Failed - Last BP in normal range    BP Readings from Last 1 Encounters:  12/16/22 (!) 150/100         Passed - Patient is not pregnant      Passed - Valid encounter within last 6 months    Recent Outpatient Visits           3 months ago Peroneal tendonitis of left lower leg   [place] [facility] at [facility] Ashley Royalty, Ocie Bob, MD   4 months ago  Type 2 diabetes mellitus with complication [facility])   [place] Primary Care & Sports Medicine at [facility], Ocie Bob, MD   7 months ago Healthcare maintenance   [facility] at [facility], Ocie Bob, MD   9 months ago Pain of midfoot, right   [facility] Health Primary Care & Sports Medicine at [facility], Ocie Bob, MD   10 months ago Primary hypertension   [place] Primary Care & Sports Medicine at [facility], Ocie Bob, MD       Future Appointments             In 5 months Ashley Royalty, Ocie Bob, MD [facility] Health Primary Care & Sports Medicine at [facility], [facility]

## 2023-04-20 ENCOUNTER — Ambulatory Visit (INDEPENDENT_AMBULATORY_CARE_PROVIDER_SITE_OTHER): Payer: No Typology Code available for payment source | Admitting: Family Medicine

## 2023-04-20 VITALS — BP 110/76 | HR 76 | Ht 73.0 in | Wt 308.8 lb

## 2023-04-20 DIAGNOSIS — J019 Acute sinusitis, unspecified: Secondary | ICD-10-CM | POA: Diagnosis not present

## 2023-04-20 MED ORDER — PROMETHAZINE-DM 6.25-15 MG/5ML PO SYRP
5.0000 mL | ORAL_SOLUTION | Freq: Four times a day (QID) | ORAL | 0 refills | Status: DC | PRN
Start: 2023-04-20 — End: 2023-09-16

## 2023-04-20 MED ORDER — AZITHROMYCIN 250 MG PO TABS
ORAL_TABLET | ORAL | 0 refills | Status: AC
Start: 2023-04-20 — End: 2023-04-25

## 2023-04-20 NOTE — Patient Instructions (Signed)
- **  Condition:** Upper Respiratory Infection (suspected bacterial sinusitis) - **Medication:** Start Azithromycin (Z-Pak) - **Nasal Spray:** Continue using Flonase - **Mucus Relief:** Add Mucinex 12-hour tablets twice daily - **Hydration:** Stay well-hydrated - **Cough Management:** Use prescribed cough syrup as needed, especially at night

## 2023-04-20 NOTE — Progress Notes (Signed)
     Primary Care / Sports Medicine Office Visit  Patient Information:  Patient ID: Travis Petty, male DOB: [birth year] Age: 47 y.o. MRN: [id number]   Travis Petty is a pleasant 47 y.o. male presenting with the following:  Chief Complaint  Patient presents with   Nasal Congestion    Nasal congestion x 5 days some drainage. Patient has been using Afrin, has been helping some. Yellow/greenish mucous.     Vitals:   04/20/23 1357  BP: 110/76  Pulse: 76  SpO2: 99%   Vitals:   04/20/23 1357  Weight: (!) 308 lb 12.8 oz (140.1 kg)  Height: 6\' 1"  (1.854 m)   Body mass index is 40.74 kg/m.  No results found.   Independent interpretation of notes and tests performed by another provider:   None  Procedures performed:   None  Pertinent History, Exam, Impression, and Recommendations:   Problem List Items Addressed This Visit       Respiratory   Acute rhinosinusitis - Primary   History of Present Illness The patient, with a history of sleep apnea managed with a CPAP machine, presents with a five-day history of upper respiratory symptoms. They describe the symptoms as similar to a "head cold," with congestion and drainage. The patient has been managing the symptoms with over-the-counter remedies, including saline spray, 2-day course of Afrin, and a cold medication containing zinc. They report that the Afrin caused some nasal bleeding, so they discontinued its use after two days. The patient also mentions a sensation of having water in their ears but denies any ear pain. They have noticed a change in the color of their sputum to yellow-green. The patient also mentions a recent trip to [facility] and a change in weather as potential contributing factors to their current symptoms.  Physical Exam HEENT: Bilateral tympanic membranes benign, right ear canal mild erythema, contralateral benign, turbinates bilaterally are erythematous and swollen, oropharynx injected, no exudate.   Equivocal tenderness with percussion throughout the sinuses. NECK: Minimally tender left posterior cervical chain lymph node. CHEST: Lungs clear to auscultation bilaterally, no wheezes, rales, rhonchi, benign cardiac sounds.  Assessment & Plan Acute rhinosinusitis Symptoms for 5 days with nasal congestion, postnasal drip, and productive cough. No shortness of breath or chest discomfort.  -Start Azithromycin (Z-Pak) for suspected bacterial sinusitis. -Continue Flonase as currently using. -Add Mucinex 12-hour tablets twice daily for mucus thinning. Ensure adequate hydration for efficacy. -Optional use of prescribed cough syrup as needed, particularly for nighttime cough.  Follow-up in May unless issues arise before then. Refills as needed.      Relevant Medications   azithromycin (ZITHROMAX) 250 MG tablet   promethazine-dextromethorphan (PROMETHAZINE-DM) 6.25-15 MG/5ML syrup     Orders & Medications Medications:  Meds ordered this encounter  Medications   azithromycin (ZITHROMAX) 250 MG tablet    Sig: Take 2 tablets on day 1, then 1 tablet daily on days 2 through 5    Dispense:  6 tablet    Refill:  0   promethazine-dextromethorphan (PROMETHAZINE-DM) 6.25-15 MG/5ML syrup    Sig: Take 5 mLs by mouth 4 (four) times daily as needed for cough.    Dispense:  118 mL    Refill:  0   No orders of the defined types were placed in this encounter.    No follow-ups on file.     Jerrol Banana, MD, [facility]   Primary Care Sports Medicine Primary Care and Sports Medicine at [facility]

## 2023-04-20 NOTE — Assessment & Plan Note (Signed)
History of Present Illness The patient, with a history of sleep apnea managed with a CPAP machine, presents with a five-day history of upper respiratory symptoms. They describe the symptoms as similar to a "head cold," with congestion and drainage. The patient has been managing the symptoms with over-the-counter remedies, including saline spray, 2-day course of Afrin, and a cold medication containing zinc. They report that the Afrin caused some nasal bleeding, so they discontinued its use after two days. The patient also mentions a sensation of having water in their ears but denies any ear pain. They have noticed a change in the color of their sputum to yellow-green. The patient also mentions a recent trip to [facility] and a change in weather as potential contributing factors to their current symptoms.  Physical Exam HEENT: Bilateral tympanic membranes benign, right ear canal mild erythema, contralateral benign, turbinates bilaterally are erythematous and swollen, oropharynx injected, no exudate.  Equivocal tenderness with percussion throughout the sinuses. NECK: Minimally tender left posterior cervical chain lymph node. CHEST: Lungs clear to auscultation bilaterally, no wheezes, rales, rhonchi, benign cardiac sounds.  Assessment & Plan Acute rhinosinusitis Symptoms for 5 days with nasal congestion, postnasal drip, and productive cough. No shortness of breath or chest discomfort.  -Start Azithromycin (Z-Pak) for suspected bacterial sinusitis. -Continue Flonase as currently using. -Add Mucinex 12-hour tablets twice daily for mucus thinning. Ensure adequate hydration for efficacy. -Optional use of prescribed cough syrup as needed, particularly for nighttime cough.  Follow-up in May unless issues arise before then. Refills as needed.

## 2023-05-15 ENCOUNTER — Encounter: Payer: Self-pay | Admitting: Family Medicine

## 2023-05-15 ENCOUNTER — Other Ambulatory Visit: Payer: Self-pay

## 2023-05-15 DIAGNOSIS — N469 Male infertility, unspecified: Secondary | ICD-10-CM

## 2023-05-15 NOTE — Telephone Encounter (Signed)
 Please review.  KP

## 2023-05-26 ENCOUNTER — Other Ambulatory Visit: Payer: Self-pay

## 2023-05-26 DIAGNOSIS — N469 Male infertility, unspecified: Secondary | ICD-10-CM

## 2023-06-30 ENCOUNTER — Ambulatory Visit (INDEPENDENT_AMBULATORY_CARE_PROVIDER_SITE_OTHER): Payer: No Typology Code available for payment source | Admitting: Podiatry

## 2023-06-30 ENCOUNTER — Encounter: Payer: Self-pay | Admitting: Podiatry

## 2023-06-30 ENCOUNTER — Ambulatory Visit (INDEPENDENT_AMBULATORY_CARE_PROVIDER_SITE_OTHER): Payer: No Typology Code available for payment source

## 2023-06-30 DIAGNOSIS — M7662 Achilles tendinitis, left leg: Secondary | ICD-10-CM

## 2023-06-30 DIAGNOSIS — M778 Other enthesopathies, not elsewhere classified: Secondary | ICD-10-CM | POA: Diagnosis not present

## 2023-06-30 DIAGNOSIS — M7732 Calcaneal spur, left foot: Secondary | ICD-10-CM | POA: Diagnosis not present

## 2023-06-30 MED ORDER — MELOXICAM 15 MG PO TABS: 15.0000 mg | ORAL_TABLET | Freq: Every day | ORAL | 0 refills | Status: DC | PRN

## 2023-06-30 MED ORDER — METHYLPREDNISOLONE 4 MG PO TBPK: ORAL_TABLET | ORAL | 0 refills | Status: DC

## 2023-06-30 NOTE — Patient Instructions (Addendum)
 For instructions on how to put on your Night Splint, please visit [email or web address]  Start with medrol dose pack and once complete you can start the meloxicam. Do not take together  Achilles Tendinitis  with Rehab Achilles tendinitis is a disorder of the Achilles tendon. The Achilles tendon connects the large calf muscles (Gastrocnemius and Soleus) to the heel bone (calcaneus). This tendon is sometimes called the heel cord. It is important for pushing-off and standing on your toes and is important for walking, running, or jumping. Tendinitis is often caused by overuse and repetitive microtrauma. SYMPTOMS Pain, tenderness, swelling, warmth, and redness may occur over the Achilles tendon even at rest. Pain with pushing off, or flexing or extending the ankle. Pain that is worsened after or during activity. CAUSES  Overuse sometimes seen with rapid increase in exercise programs or in sports requiring running and jumping. Poor physical conditioning (strength and flexibility or endurance). Running sports, especially training running down hills. Inadequate warm-up before practice or play or failure to stretch before participation. Injury to the tendon. PREVENTION  Warm up and stretch before practice or competition. Allow time for adequate rest and recovery between practices and competition. Keep up conditioning. Keep up ankle and leg flexibility. Improve or keep muscle strength and endurance. Improve cardiovascular fitness. Use proper technique. Use proper equipment (shoes, skates). To help prevent recurrence, taping, protective strapping, or an adhesive bandage may be recommended for several weeks after healing is complete. PROGNOSIS  Recovery may take weeks to several months to heal. Longer recovery is expected if symptoms have been prolonged. Recovery is usually quicker if the inflammation is due to a direct blow as compared with overuse or sudden strain. RELATED COMPLICATIONS   Healing time will be prolonged if the condition is not correctly treated. The injury must be given plenty of time to heal. Symptoms can reoccur if activity is resumed too soon. Untreated, tendinitis may increase the risk of tendon rupture requiring additional time for recovery and possibly surgery. TREATMENT  The first treatment consists of rest anti-inflammatory medication, and ice to relieve the pain. Stretching and strengthening exercises after resolution of pain will likely help reduce the risk of recurrence. Referral to a physical therapist or athletic trainer for further evaluation and treatment may be helpful. A walking boot or cast may be recommended to rest the Achilles tendon. This can help break the cycle of inflammation and microtrauma. Arch supports (orthotics) may be prescribed or recommended by your caregiver as an adjunct to therapy and rest. Surgery to remove the inflamed tendon lining or degenerated tendon tissue is rarely necessary and has shown less than predictable results. MEDICATION  Nonsteroidal anti-inflammatory medications, such as aspirin and ibuprofen, may be used for pain and inflammation relief. Do not take within 7 days before surgery. Take these as directed by your caregiver. Contact your caregiver immediately if any bleeding, stomach upset, or signs of allergic reaction occur. Other minor pain relievers, such as acetaminophen, may also be used. Pain relievers may be prescribed as necessary by your caregiver. Do not take prescription pain medication for longer than 4 to 7 days. Use only as directed and only as much as you need. Cortisone injections are rarely indicated. Cortisone injections may weaken tendons and predispose to rupture. It is better to give the condition more time to heal than to use them. HEAT AND COLD Cold is used to relieve pain and reduce inflammation for acute and chronic Achilles tendinitis. Cold should be applied for 10 to 15 minutes  every 2 to 3  hours for inflammation and pain and immediately after any activity that aggravates your symptoms. Use ice packs or an ice massage. Heat may be used before performing stretching and strengthening activities prescribed by your caregiver. Use a heat pack or a warm soak. SEEK MEDICAL CARE IF: Symptoms get worse or do not improve in 2 weeks despite treatment. New, unexplained symptoms develop. Drugs used in treatment may produce side effects.  EXERCISES:  RANGE OF MOTION (ROM) AND STRETCHING EXERCISES - Achilles Tendinitis  These exercises may help you when beginning to rehabilitate your injury. Your symptoms may resolve with or without further involvement from your physician, physical therapist or athletic trainer. While completing these exercises, remember:  Restoring tissue flexibility helps normal motion to return to the joints. This allows healthier, less painful movement and activity. An effective stretch should be held for at least 30 seconds. A stretch should never be painful. You should only feel a gentle lengthening or release in the stretched tissue.  STRETCH  Gastroc, Standing  Place hands on wall. Extend right / left leg, keeping the front knee somewhat bent. Slightly point your toes inward on your back foot. Keeping your right / left heel on the floor and your knee straight, shift your weight toward the wall, not allowing your back to arch. You should feel a gentle stretch in the right / left calf. Hold this position for 10 seconds. Repeat 3 times. Complete this stretch 2 times per day.  STRETCH  Soleus, Standing  Place hands on wall. Extend right / left leg, keeping the other knee somewhat bent. Slightly point your toes inward on your back foot. Keep your right / left heel on the floor, bend your back knee, and slightly shift your weight over the back leg so that you feel a gentle stretch deep in your back calf. Hold this position for 10 seconds. Repeat 3 times. Complete this  stretch 2 times per day.  STRETCH  Gastrocsoleus, Standing  Note: This exercise can place a lot of stress on your foot and ankle. Please complete this exercise only if specifically instructed by your caregiver.  Place the ball of your right / left foot on a step, keeping your other foot firmly on the same step. Hold on to the wall or a rail for balance. Slowly lift your other foot, allowing your body weight to press your heel down over the edge of the step. You should feel a stretch in your right / left calf. Hold this position for 10 seconds. Repeat this exercise with a slight bend in your knee. Repeat 3 times. Complete this stretch 2 times per day.   STRENGTHENING EXERCISES - Achilles Tendinitis These exercises may help you when beginning to rehabilitate your injury. They may resolve your symptoms with or without further involvement from your physician, physical therapist or athletic trainer. While completing these exercises, remember:  Muscles can gain both the endurance and the strength needed for everyday activities through controlled exercises. Complete these exercises as instructed by your physician, physical therapist or athletic trainer. Progress the resistance and repetitions only as guided. You may experience muscle soreness or fatigue, but the pain or discomfort you are trying to eliminate should never worsen during these exercises. If this pain does worsen, stop and make certain you are following the directions exactly. If the pain is still present after adjustments, discontinue the exercise until you can discuss the trouble with your clinician.  STRENGTH - Plantar-flexors  Sit  with your right / left leg extended. Holding onto both ends of a rubber exercise band/tubing, loop it around the ball of your foot. Keep a slight tension in the band. Slowly push your toes away from you, pointing them downward. Hold this position for 10 seconds. Return slowly, controlling the tension in the  band/tubing. Repeat 3 times. Complete this exercise 2 times per day.   STRENGTH - Plantar-flexors  Stand with your feet shoulder width apart. Steady yourself with a wall or table using as little support as needed. Keeping your weight evenly spread over the width of your feet, rise up on your toes.* Hold this position for 10 seconds. Repeat 3 times. Complete this exercise 2 times per day.  *If this is too easy, shift your weight toward your right / left leg until you feel challenged. Ultimately, you may be asked to do this exercise with your right / left foot only.  STRENGTH  Plantar-flexors, Eccentric  Note: This exercise can place a lot of stress on your foot and ankle. Please complete this exercise only if specifically instructed by your caregiver.  Place the balls of your feet on a step. With your hands, use only enough support from a wall or rail to keep your balance. Keep your knees straight and rise up on your toes. Slowly shift your weight entirely to your right / left toes and pick up your opposite foot. Gently and with controlled movement, lower your weight through your right / left foot so that your heel drops below the level of the step. You will feel a slight stretch in the back of your calf at the end position. Use the healthy leg to help rise up onto the balls of both feet, then lower weight only on the right / left leg again. Build up to 15 repetitions. Then progress to 3 consecutive sets of 15 repetitions.* After completing the above exercise, complete the same exercise with a slight knee bend (about 30 degrees). Again, build up to 15 repetitions. Then progress to 3 consecutive sets of 15 repetitions.* Perform this exercise 2 times per day.  *When you easily complete 3 sets of 15, your physician, physical therapist or athletic trainer may advise you to add resistance by wearing a backpack filled with additional weight.  STRENGTH - Plantar Flexors, Seated  Sit on a chair that  allows your feet to rest flat on the ground. If necessary, sit at the edge of the chair. Keeping your toes firmly on the ground, lift your right / left heel as far as you can without increasing any discomfort in your ankle. Repeat 3 times. Complete this exercise 2 times a day.

## 2023-07-03 NOTE — Progress Notes (Signed)
 Subjective: Chief Complaint  Patient presents with   Foot Pain    RM#12 Left foot pain starts on the back of heel several years but now it is constant.   48 year old male presents the office with concerns of left foot pain starting the back of the heels.  This been ongoing for several years has been intermittent but recently becoming more consistent.  Does not report any recent injuries.  No recent treatment otherwise.  No radiating pain.  No other concerns.  Objective: AAO x3, NAD DP/PT pulses palpable bilaterally, CRT less than 3 seconds Tenderness palpation on the posterior aspect calcaneus on insertion of Achilles tendon on the area of prominent spur on the posterior heel.  Achilles tendon appears to be intact.  There is no pain with lateral compression of calcaneus.  Mild discomfort on the course the peroneal tendon.  Clinically the tendon appears to be intact.  There is no areas of pinpoint tenderness.  Slight edema of the posterior heel but there is no erythema or warmth.  MMT 5/5. No pain with calf compression, swelling, warmth, erythema  Assessment: Achilles tendinitis, posterior heel spur  Plan: -All treatment options discussed with the patient including all alternatives, risks, complications.  -X-rays were obtained reviewed.  3 views of the foot were obtained.  No evidence of acute fracture.  Calcaneal spurring present posteriorly. -Prescribe Medrol Dosepak once complete can start meloxicam. -We discussed stretching, icing a regular basis.  Discussed shoes, good arch support.  He can consider heel lifts if needed as well. -Patient encouraged to call the office with any questions, concerns, change in symptoms.   Vivi Barrack DPM

## 2023-07-06 ENCOUNTER — Encounter: Payer: Self-pay | Admitting: Family Medicine

## 2023-07-06 NOTE — Telephone Encounter (Signed)
 Please review

## 2023-07-22 ENCOUNTER — Other Ambulatory Visit: Payer: Self-pay | Admitting: Family Medicine

## 2023-07-22 DIAGNOSIS — Z3141 Encounter for fertility testing: Secondary | ICD-10-CM

## 2023-07-27 ENCOUNTER — Other Ambulatory Visit: Payer: Self-pay | Admitting: Podiatry

## 2023-08-07 ENCOUNTER — Encounter: Payer: Self-pay | Admitting: Emergency Medicine

## 2023-08-07 ENCOUNTER — Ambulatory Visit
Admission: EM | Admit: 2023-08-07 | Discharge: 2023-08-07 | Disposition: A | Discharge status: Home / Self Care | Attending: Family Medicine | Admitting: Family Medicine

## 2023-08-07 DIAGNOSIS — F439 Reaction to severe stress, unspecified: Secondary | ICD-10-CM

## 2023-08-07 DIAGNOSIS — R002 Palpitations: Secondary | ICD-10-CM

## 2023-08-07 NOTE — ED Triage Notes (Signed)
 Patient states that his "heart is pounding or beating fast".  Patient denies any chest pain or SOB.  Patient states that is started about an hour during an argument.  Patient states that he is under stress.  Patient denies N/V.

## 2023-08-07 NOTE — ED Provider Notes (Signed)
 [facility]    CSN: [id number] Arrival date & time: 08/07/23  1909      History   Chief Complaint Chief Complaint  Patient presents with   Palpitations    HPI Travis Petty is a 48 y.o. male presents for evaluation of palpitations.  Patient reports 1 hour prior to arrival he was in a heated argument with his girlfriend.  During this time he began developing palpitations that he describes as his heart was racing.  He denied any chest pain, shortness of breath, dizziness, syncope.  States the palpitations have since resolved.  He does have a history of high blood pressure, diabetes.  No smoking history.  He denies any alcohol or illicit drug use.  No family history of any arrhythmias.  States he only had 1 cup of caffeine today and denies energy drinks or dietary supplements.  He does endorse increasing stress as his girlfriend is pregnant.  No other concerns at this time.   Palpitations   Past Medical History:  Diagnosis Date   Adenomatous polyps 06/07/2018   Formatting of this note might be different from the original.  Added automatically from request for surgery [id number]     Allergy    Bronchitis 05/25/2018   Last Assessment & Plan:  Formatting of this note might be different from the original. Reviewed hx, sx, exam with pt Proceed with antibx With recent doxycycline and allergies, will proceed with rx levofloxacin. Also included short burst of po steroid. If symptoms not improved in next 3-5 days with treatment/plan recommended, patient was advised to follow up with office for further recommendations.   Cholecystitis    Choledocholithiasis 04/30/2019   Deviated septum    GERD (gastroesophageal reflux disease)    Gout    H/O colonoscopy with polypectomy 05/07/2018   Formatting of this note might be different from the original.  Last Assessment & Plan:   Formatting of this note might be different from the original.  Refer for consideration for repeat colonoscopy   Last Assessment & Plan:   Formatting of this note might be different from the original.  Refer for consideration for repeat colonoscopy     Hypertension    Left Achilles tendinitis 11/04/2018   Last Assessment & Plan:  Formatting of this note might be different from the original. 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 This appears benign just monitor.    Patient Active Problem List   Diagnosis Date Noted   Acute rhinosinusitis 04/20/2023   Peroneal tendonitis of left lower leg 12/16/2022   Class 2 obesity with alveolar hypoventilation, serious comorbidity, and body mass index (BMI) of 39.0 to 39.9 in adult (HCC) 12/10/2022   Toe pain, right 12/10/2022   Mass of lower outer quadrant of left breast 09/10/2022   Subareolar mass of right breast 09/10/2022   OSA on CPAP 05/30/2021   Allergic rhinitis 05/30/2021   Chronic left-sided lumbar radiculopathy 05/30/2021   Other constipation 05/05/2019   Type 2 diabetes mellitus with complication (HCC) 11/04/2018   Colon polyp 05/07/2018   Dyslipidemia 05/07/2018   Primary hypertension 05/07/2018   Chronic rhinitis 04/13/2011    Past Surgical History:  Procedure Laterality Date   CHOLECYSTECTOMY  2020   WISDOM TOOTH EXTRACTION  Home Medications    Prior to Admission medications   Medication Sig Start Date End Date Taking? Authorizing Provider  azelastine (OPTIVAR) 0.05 % ophthalmic solution INSTILL 1 DROP INTO BOTH EYES TWICE A DAY 10/23/22   Jerrol Banana, MD  fluticasone (FLONASE) 50 MCG/ACT nasal spray Place 1 spray into both nostrils daily. 06/10/22   Jerrol Banana, MD  levocetirizine (XYZAL) 5 MG tablet Take 1 tablet (5 mg total) by mouth every evening. 06/10/22   Jerrol Banana, MD   losartan-hydrochlorothiazide [facility]) 100-25 MG tablet TAKE 1 TABLET BY MOUTH DAILY 04/13/23   Jerrol Banana, MD  meloxicam (MOBIC) 15 MG tablet TAKE 1 TABLET BY MOUTH EVERY DAY AS NEEDED FOR PAIN 07/27/23   Vivi Barrack, DPM  metFORMIN (GLUCOPHAGE-XR) 500 MG 24 hr tablet TAKE 1 TABLET BY MOUTH DAILY  WITH BREAKFAST Patient not taking: Reported on 06/30/2023 04/13/23   Jerrol Banana, MD  methylPREDNISolone (MEDROL DOSEPAK) 4 MG TBPK tablet Take as directed 06/30/23   Vivi Barrack, DPM  metoprolol succinate (TOPROL-XL) 25 MG 24 hr tablet TAKE 1 TABLET BY MOUTH DAILY 04/13/23   Jerrol Banana, MD  OZEMPIC, 2 MG/DOSE, 8 MG/3ML SOPN INJECT SUBCUTANEOUSLY 2 MG EVERY WEEK 09/17/22   Jerrol Banana, MD  promethazine-dextromethorphan (PROMETHAZINE-DM) 6.25-15 MG/5ML syrup Take 5 mLs by mouth 4 (four) times daily as needed for cough. 04/20/23   Jerrol Banana, MD    Family History Family History  Problem Relation Age of Onset   Hypertension Mother    Allergies Mother    Thyroid disease Mother    Hypertension Father    Dementia Father    Hypertension Maternal Grandmother    Hypertension Maternal Grandfather    Hypertension Paternal Grandmother    Colon cancer Paternal Grandmother    Hypertension Paternal Grandfather    Prostate cancer Paternal Uncle    Breast cancer Other        Multiple paternal relatives    Social History Social History   Tobacco Use   Smoking status: Former    Current packs/day: 0.00    Average packs/day: 1 pack/day for 16.0 years (16.0 ttl pk-yrs)    Types: Cigarettes    Start date: 06/18/1991    Quit date: 06/18/2007    Years since quitting: 16.1   Smokeless tobacco: Former  Building services engineer status: Never Used  Substance Use Topics   Alcohol use: Yes    Alcohol/week: 1.0 standard drink of alcohol    Types: 1 Standard drinks or equivalent per week   Drug use: Not Currently     Allergies   Amoxil [amoxicillin], Penicillins, Soap,  and Calcium channel blockers   Review of Systems Review of Systems  Cardiovascular:  Positive for palpitations.     Physical Exam Triage Vital Signs ED Triage Vitals  Encounter Vitals Group     BP 08/07/23 1919 (!) 176/99     Systolic BP Percentile --      Diastolic BP Percentile --      Pulse Rate 08/07/23 1919 84     Resp 08/07/23 1919 17     Temp 08/07/23 1919 98.8 F (37.1 C)     Temp Source 08/07/23 1919 Oral     SpO2 08/07/23 1919 96 %     Weight 08/07/23 1917 (!) 308 lb 13.8 oz (140.1 kg)     Height 08/07/23 1917 6\' 1"  (1.854 m)     Head Circumference --  Peak Flow --      Pain Score 08/07/23 1917 0     Pain Loc --      Pain Education --      Exclude from Growth Chart --    No data found.  Updated Vital Signs BP (!) 176/108 (BP Location: Left Arm)   Pulse 84   Temp 98.8 F (37.1 C) (Oral)   Resp 17   Ht 6\' 1"  (1.854 m)   Wt (!) 308 lb 13.8 oz (140.1 kg)   SpO2 96%   BMI 40.75 kg/m   Visual Acuity Right Eye Distance:   Left Eye Distance:   Bilateral Distance:    Right Eye Near:   Left Eye Near:    Bilateral Near:     Physical Exam Vitals and nursing note reviewed.  Constitutional:      General: He is not in acute distress.    Appearance: Normal appearance. He is obese. He is not ill-appearing.  HENT:     Head: Normocephalic and atraumatic.  Eyes:     Pupils: Pupils are equal, round, and reactive to light.  Cardiovascular:     Rate and Rhythm: Normal rate and regular rhythm.     Heart sounds: Normal heart sounds. No murmur heard. Pulmonary:     Effort: Pulmonary effort is normal.     Breath sounds: Normal breath sounds.  Skin:    General: Skin is warm and dry.  Neurological:     General: No focal deficit present.     Mental Status: He is alert and oriented to person, place, and time.  Psychiatric:        Mood and Affect: Mood normal.        Behavior: Behavior normal.      UC Treatments / Results  Labs (all labs ordered are  listed, but only abnormal results are displayed) Labs Reviewed - No data to display  EKG   Radiology No results found.  Procedures ED EKG  Date/Time: 08/07/2023 7:27 PM  Performed by: Radford Pax, NP Authorized by: Radford Pax, NP   ECG interpreted by ED Physician in the absence of a cardiologist: no   Previous ECG:    Previous ECG:  Unavailable Interpretation:    Interpretation: normal   Rate:    ECG rate:  81   ECG rate assessment: normal   Rhythm:    Rhythm: sinus rhythm   Ectopy:    Ectopy: none   QRS:    QRS axis:  Normal ST segments:    ST segments:  Normal T waves:    T waves: normal    (including critical care time)  Medications Ordered in UC Medications - No data to display  Initial Impression / Assessment and Plan / UC Course  I have reviewed the triage vital signs and the nursing notes.  Pertinent labs & imaging results that were available during my care of the patient were reviewed by me and considered in my medical decision making (see chart for details).     Reviewed exam and symptoms with patient.  No red flags.  Patient presenting with palpitations 1 hour PTA that have since resolved.  He is currently asymptomatic without palpitations, chest pain, shortness of breath, headaches.  EKG is unremarkable.  Discussed likely stress-induced given the argument with his girlfriend.  BP was elevated on arrival and remained elevated after recheck.  Patient denies any headache, dizziness, blurry vision.  Advised him to check his blood pressure  at home in the next hour to and if it remains elevated or he develops any other symptoms, red flags reviewed, he is to go to the emergency room for further workup.  Advised to follow-up with his PCP next week for follow-up.  Strict ER precautions reviewed and patient verbalized understanding. Final Clinical Impressions(s) / UC Diagnoses   Final diagnoses:  Palpitations  Stress at home     Discharge Instructions       Please go to the emergency room if your symptoms return.  Keep a log of your blood pressure and take this to your PCP.  Follow-up with your PCP next week for further workup and treatment if your symptoms become more frequent.  Avoid caffeine and stay hydrated.  Try to avoid stressful situations if at all possible.     ED Prescriptions   None    PDMP not reviewed this encounter.   Radford Pax, NP 08/07/23 1950

## 2023-08-07 NOTE — Discharge Instructions (Addendum)
 Please go to the emergency room if your symptoms return.  Keep a log of your blood pressure and take this to your PCP.  Follow-up with your PCP next week for further workup and treatment if your symptoms become more frequent.  Avoid caffeine and stay hydrated.  Try to avoid stressful situations if at all possible.

## 2023-08-12 ENCOUNTER — Ambulatory Visit: Admitting: Urology

## 2023-09-16 ENCOUNTER — Telehealth: Payer: Self-pay | Admitting: Family Medicine

## 2023-09-16 ENCOUNTER — Encounter: Payer: Self-pay | Admitting: Family Medicine

## 2023-09-16 ENCOUNTER — Ambulatory Visit (INDEPENDENT_AMBULATORY_CARE_PROVIDER_SITE_OTHER): Payer: Self-pay | Admitting: Family Medicine

## 2023-09-16 VITALS — BP 120/90 | HR 78 | Ht 73.0 in | Wt 310.0 lb

## 2023-09-16 DIAGNOSIS — G4733 Obstructive sleep apnea (adult) (pediatric): Secondary | ICD-10-CM

## 2023-09-16 DIAGNOSIS — M25552 Pain in left hip: Secondary | ICD-10-CM | POA: Diagnosis not present

## 2023-09-16 DIAGNOSIS — Z7985 Long-term (current) use of injectable non-insulin antidiabetic drugs: Secondary | ICD-10-CM | POA: Insufficient documentation

## 2023-09-16 DIAGNOSIS — Z0000 Encounter for general adult medical examination without abnormal findings: Secondary | ICD-10-CM

## 2023-09-16 DIAGNOSIS — N400 Enlarged prostate without lower urinary tract symptoms: Secondary | ICD-10-CM

## 2023-09-16 DIAGNOSIS — E785 Hyperlipidemia, unspecified: Secondary | ICD-10-CM

## 2023-09-16 DIAGNOSIS — M79675 Pain in left toe(s): Secondary | ICD-10-CM | POA: Insufficient documentation

## 2023-09-16 DIAGNOSIS — E118 Type 2 diabetes mellitus with unspecified complications: Secondary | ICD-10-CM

## 2023-09-16 DIAGNOSIS — E559 Vitamin D deficiency, unspecified: Secondary | ICD-10-CM | POA: Diagnosis not present

## 2023-09-16 DIAGNOSIS — I10 Essential (primary) hypertension: Secondary | ICD-10-CM | POA: Diagnosis not present

## 2023-09-16 MED ORDER — CELECOXIB 200 MG PO CAPS: 200.0000 mg | ORAL_CAPSULE | Freq: Two times a day (BID) | ORAL | 2 refills | Status: DC | PRN

## 2023-09-16 NOTE — Assessment & Plan Note (Signed)
 He experiences a 'twinge' and tightness in his left hip, particularly when standing up. The pain is located on the outer hip. He suspects his sleeping position may exacerbate the pain. He has not been engaging in specific exercises that might alleviate the issue, such as those from his football days.  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 Symptoms worsen with contact and movement. - Prescribe Celebrex 2 mg anti-inflammatory medication for hip pain.  Take 1-2 times daily on an as-needed basis for pain, take with food. - Refer to physical therapy for targeted exercises. - Consider ultrasound-guided injection if symptoms persist. - Encourage home exercises to strengthen hip stabilizers.

## 2023-09-17 MED ORDER — TIRZEPATIDE 7.5 MG/0.5ML ~~LOC~~ SOAJ: 7.5000 mg | SUBCUTANEOUS | 2 refills | Status: DC

## 2023-09-17 NOTE — Assessment & Plan Note (Signed)
 Obstructive sleep apnea CPAP adherence issues, especially during travel. Discussed health risks and travel-sized CPAP device option. - Discuss the option of a travel-sized CPAP device (ResMed AirMini) with insurance for coverage. - Consider referral to sleep medicine if unable to obtain device through primary care.

## 2023-09-17 NOTE — Assessment & Plan Note (Signed)
 He reports a new occurrence of pain in his left big toe, which has been sore for a couple of weeks. The location is the front of the big toe, on the inside. He has been taking meloxicam , but the pain persists. The skin over the affected area is peeling, attributed to swelling.  Left great toe pain New left great toe pain and swelling, possible gout. Previous x-ray negative for arthritis. Dietary triggers identified. Uric acid level ordered. - Order uric acid level to assess for gout. - Prescribe anti-inflammatory medication for toe pain. - Monitor response to medication and adjust treatment if necessary.

## 2023-09-17 NOTE — Assessment & Plan Note (Signed)
 Annual examination completed, risk stratification labs ordered, anticipatory guidance provided.  We will follow labs once resulted.

## 2023-09-17 NOTE — Progress Notes (Signed)
 Annual Physical Exam Visit  Patient Information:  Patient ID: Travis Petty, male DOB: 1976-03-04 Age: 48 y.o. MRN: [id number]   Subjective:   CC: Annual Physical Exam  HPI:  Travis Petty is here for their annual physical.  I reviewed the past medical history, family history, social history, surgical history, and allergies today and changes were made as necessary.  Please see the problem list section below for additional details.  Past Medical History: Past Medical History:  Diagnosis Date   Adenomatous polyps 06/07/2018   Formatting of this note might be different from the original.  Added automatically from request for surgery [id number]     Allergy    Bronchitis 05/25/2018   Last Assessment & Plan:  Formatting of this note might be different from the original. Reviewed hx, sx, exam with pt Proceed with antibx With recent doxycycline and allergies, will proceed with rx levofloxacin. Also included short burst of po steroid. If symptoms not improved in next 3-5 days with treatment/plan recommended, patient was advised to follow up with office for further recommendations.   Cholecystitis    Choledocholithiasis 04/30/2019   Deviated septum    GERD (gastroesophageal reflux disease)    Gout    H/O colonoscopy with polypectomy 05/07/2018   Formatting of this note might be different from the original.  Last Assessment & Plan:   Formatting of this note might be different from the original.  Refer for consideration for repeat colonoscopy  Last Assessment & Plan:   Formatting of this note might be different from the original.  Refer for consideration for repeat colonoscopy     Hypertension    Left Achilles tendinitis 11/04/2018   Last Assessment & Plan:  Formatting of this note might be different from the original. Follow up with Ortho   OSA on CPAP    Posterior tibialis tendinitis of both lower extremities 03/05/2022   S/P laparoscopic cholecystectomy 04/30/2019   Skin lesions  06/28/2019   Last Assessment & Plan:  Formatting of this note might be different from the original. This appears benign just monitor.   Past Surgical History: Past Surgical History:  Procedure Laterality Date   CHOLECYSTECTOMY  2020   WISDOM TOOTH EXTRACTION     Family History: Family History  Problem Relation Age of Onset   Hypertension Mother    Allergies Mother    Thyroid disease Mother    Hypertension Father    Dementia Father    Hypertension Maternal Grandmother    Hypertension Maternal Grandfather    Hypertension Paternal Grandmother    Colon cancer Paternal Grandmother    Hypertension Paternal Grandfather    Prostate cancer Paternal Uncle    Breast cancer Other        Multiple paternal relatives   Allergies: Allergies  Allergen Reactions   Amoxil [Amoxicillin] Hives and Itching   Penicillins Hives and Itching   Soap Hives    Ivory Soap   Calcium Channel Blockers Other (See Comments)    bleeding   Health Maintenance: Health Maintenance  Topic Date Due   Pneumococcal Vaccine 36-24 Years old (1 of 2 - PCV) Never done   OPHTHALMOLOGY EXAM  04/19/2023   HEMOGLOBIN A1C  06/12/2023   Diabetic kidney evaluation - eGFR measurement  09/10/2023   Diabetic kidney evaluation - Urine ACR  09/10/2023   INFLUENZA VACCINE  12/04/2023   FOOT EXAM  12/16/2023   DTaP/Tdap/Td (2 - Td or Tdap) 11/03/2028   Colonoscopy  03/07/2030  Hepatitis C Screening  Completed   HIV Screening  Completed   HPV VACCINES  Aged Out   Meningococcal B Vaccine  Aged Out   COVID-19 Vaccine  Discontinued    HM Colonoscopy          Upcoming     Colonoscopy (Every 10 Years) Next due on 03/07/2030    03/07/2020  Done - ENDOSCOPIC DIAGNOSIS: Normal COLONOSCOPY to the cecum.   Only the first 1 history entries have been loaded, but more history exists.               Medications: Current Outpatient Medications on File Prior to Visit  Medication Sig Dispense Refill   azelastine  (OPTIVAR )  0.05 % ophthalmic solution INSTILL 1 DROP INTO BOTH EYES TWICE A DAY 18 mL 1   fluticasone  (FLONASE ) 50 MCG/ACT nasal spray Place 1 spray into both nostrils daily. 16 g 1   levocetirizine (XYZAL ) 5 MG tablet Take 1 tablet (5 mg total) by mouth every evening. 90 tablet 1   losartan -hydrochlorothiazide (HYZAAR) 100-25 MG tablet TAKE 1 TABLET BY MOUTH DAILY 90 tablet 3   metoprolol  succinate (TOPROL -XL) 25 MG 24 hr tablet TAKE 1 TABLET BY MOUTH DAILY 90 tablet 3   OZEMPIC , 2 MG/DOSE, 8 MG/3ML SOPN INJECT SUBCUTANEOUSLY 2 MG EVERY WEEK 9 mL 3   metFORMIN  (GLUCOPHAGE -XR) 500 MG 24 hr tablet TAKE 1 TABLET BY MOUTH DAILY  WITH BREAKFAST (Patient not taking: Reported on 09/16/2023) 90 tablet 3   No current facility-administered medications on file prior to visit.    Objective:   Vitals:   09/16/23 0907  BP: (!) 120/90  Pulse: 78  SpO2: 99%   Vitals:   09/16/23 0907  Weight: (!) 310 lb (140.6 kg)  Height: 6\' 1"  (1.854 m)   Body mass index is 40.9 kg/m.  General: Well Developed, well nourished, and in no acute distress.  Neuro: Alert and oriented x3, extra-ocular muscles intact, sensation grossly intact. Cranial nerves II through XII are grossly intact, motor, sensory, and coordinative functions are intact. HEENT: Normocephalic, atraumatic, neck supple, no masses, no lymphadenopathy, thyroid nonenlarged. Oropharynx, nasopharynx, external ear canals are unremarkable. Skin: Warm and dry, no rashes noted.  Cardiac: Regular rate and rhythm, no murmurs rubs or gallops. No peripheral edema. Pulses symmetric. Respiratory: Clear to auscultation bilaterally. Speaking in full sentences.  Abdominal: Soft, nontender, nondistended, positive bowel sounds, no masses, no organomegaly. Musculoskeletal: Stable, and with full range of motion.  Impression and Recommendations:   The patient was counselled, risk factors were discussed, and anticipatory guidance given.  Problem List Items Addressed This Visit      Dyslipidemia   Relevant Orders   Comprehensive metabolic panel with GFR   Lipid panel   Great toe pain, left       He reports a new occurrence of pain in his left big toe, which has been sore for a couple of weeks. The location is the front of the big toe, on the inside. He has been taking meloxicam , but the pain persists. The skin over the affected area is peeling, attributed to swelling.  Left great toe pain New left great toe pain and swelling, possible gout. Previous x-ray negative for arthritis. Dietary triggers identified. Uric acid level ordered. - Order uric acid level to assess for gout. - Prescribe anti-inflammatory medication for toe pain. - Monitor response to medication and adjust treatment if necessary.     Relevant Orders   Uric acid   Greater trochanteric pain  syndrome of left lower extremity   He experiences a 'twinge' and tightness in his left hip, particularly when standing up. The pain is located on the outer hip. He suspects his sleeping position may exacerbate the pain. He has not been engaging in specific exercises that might alleviate the issue, such as those from his football days.  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 Symptoms worsen with contact and movement. - Prescribe Celebrex 2 mg anti-inflammatory medication for hip pain.  Take 1-2 times daily on an as-needed basis for pain, take with food. - Refer to physical therapy for targeted exercises. - Consider ultrasound-guided injection if symptoms persist. - Encourage home exercises to strengthen hip stabilizers.      Relevant Orders   Ambulatory referral to Physical Therapy   Healthcare maintenance - Primary   Annual examination completed, risk stratification labs ordered, anticipatory guidance provided.  We will follow labs once resulted.      Relevant Orders   CBC   Long-term (current) use of  injectable non-insulin antidiabetic drugs   Type 2 diabetes mellitus Blood glucose generally well-controlled on Ozempic . Discussed potential switch to Mounjaro for improved management.  Will transition from Ozempic  2 mg to Mounjaro 7.5 mg with plans for further titration. - Prescribe Mounjaro 7.5 mg and send prescription to [facility]. - Monitor blood glucose levels and adjust treatment as needed. - Order labs to assess current diabetes control.      Relevant Medications   tirzepatide (MOUNJARO) 7.5 MG/0.5ML Pen   Other Relevant Orders   Hemoglobin A1c   TSH   Urine Microalbumin w/creat. ratio   OSA on CPAP   Obstructive sleep apnea CPAP adherence issues, especially during travel. Discussed health risks and travel-sized CPAP device option. - Discuss the option of a travel-sized CPAP device (ResMed AirMini) with insurance for coverage. - Consider referral to sleep medicine if unable to obtain device through primary care.      Relevant Medications   tirzepatide (MOUNJARO) 7.5 MG/0.5ML Pen   Primary hypertension   Hypertension Diastolic blood pressure slightly elevated, likely related to sleep apnea. Systolic well-controlled. - Encourage consistent use of CPAP to improve blood pressure control. - Monitor blood pressure and adjust treatment as needed.      Relevant Orders   Comprehensive metabolic panel with GFR   Lipid panel   Type 2 diabetes mellitus with complication (HCC)   Relevant Medications   tirzepatide (MOUNJARO) 7.5 MG/0.5ML Pen   Other Visit Diagnoses       Vitamin D  deficiency       Relevant Orders   VITAMIN D  25 Hydroxy (Vit-D Deficiency, Fractures)     Benign prostatic hyperplasia, unspecified whether lower urinary tract symptoms present       Relevant Orders   PSA Total (Reflex To Free)        Orders & Medications Medications:  Meds ordered this encounter  Medications   celecoxib (CELEBREX) 200 MG capsule    Sig: Take 1 capsule (200 mg total)  by mouth 2 (two) times daily as needed. One to 2 tablets by mouth daily as needed for pain.    Dispense:  60 capsule    Refill:  2   tirzepatide (MOUNJARO) 7.5 MG/0.5ML Pen    Sig: Inject 7.5 mg into the skin once a week.    Dispense:  6 mL    Refill:  2   Orders Placed This Encounter  Procedures   Uric acid   CBC   Comprehensive metabolic panel with GFR   Hemoglobin A1c   Lipid panel   PSA Total (Reflex To Free)   TSH   VITAMIN D  25 Hydroxy (Vit-D Deficiency, Fractures)   Urine Microalbumin w/creat. ratio   Ambulatory referral to Physical Therapy     Return in about 3 months (around 12/17/2023).    Ma Saupe, MD, [facility]   Primary Care Sports Medicine Primary Care and Sports Medicine at [facility]

## 2023-09-17 NOTE — Assessment & Plan Note (Signed)
 Hypertension Diastolic blood pressure slightly elevated, likely related to sleep apnea. Systolic well-controlled. - Encourage consistent use of CPAP to improve blood pressure control. - Monitor blood pressure and adjust treatment as needed.

## 2023-09-17 NOTE — Patient Instructions (Signed)
-   Obtain fasting labs with orders provided (can have water or black coffee but otherwise no food or drink x 8 hours before labs) - Review information provided - Attend eye doctor annually, dentist every 6 months, work towards or maintain 30 minutes of moderate intensity physical activity at least 5 days per week, and consume a balanced diet - Return in 1 year for physical - Contact us  for any questions between now and then   Patient Plan  Dyslipidemia: 1. Complete blood tests: comprehensive metabolic panel and lipid panel as ordered.  Left Great Toe Pain: 1. Take prescribed anti-inflammatory medication for toe pain. 2. Monitor how you feel with the medication and report any changes. 3. Await results of the uric acid test to check for gout.  Left Hip Pain (Greater Trochanteric Pain Syndrome): 1. Take Celebrex 2 mg once or twice daily as needed for hip pain, with food. 2. Attend physical therapy for targeted hip exercises. 3. Consider home exercises to strengthen hip stabilizers. 4. If pain persists, discuss the option of an ultrasound-guided injection.  Type 2 Diabetes Management: 1. Transition from Ozempic  to Mounjaro 7.5 mg; prescription will be sent to [facility]. 2. Monitor blood glucose levels regularly and report any significant changes. 3. Complete ordered labs to assess diabetes control.  Obstructive Sleep Apnea: 1. Discuss the potential for a travel-sized CPAP device (such as ResMed AirMini) with your insurance for coverage. 2. If unable to obtain a device through primary care, consider referral to sleep medicine.  Hypertension: 1. Use CPAP consistently to help control blood pressure. 2. Monitor your blood pressure regularly and adjust treatment as needed.  Red Flags: - If you experience worsening symptoms or new issues, please contact your healthcare provider promptly.

## 2023-09-17 NOTE — Assessment & Plan Note (Signed)
 Type 2 diabetes mellitus Blood glucose generally well-controlled on Ozempic . Discussed potential switch to Mounjaro for improved management.  Will transition from Ozempic  2 mg to Mounjaro 7.5 mg with plans for further titration. - Prescribe Mounjaro 7.5 mg and send prescription to [facility]. - Monitor blood glucose levels and adjust treatment as needed. - Order labs to assess current diabetes control.

## 2023-09-18 ENCOUNTER — Ambulatory Visit: Payer: Self-pay | Admitting: Family Medicine

## 2023-09-18 DIAGNOSIS — M10079 Idiopathic gout, unspecified ankle and foot: Secondary | ICD-10-CM

## 2023-09-18 LAB — CBC
Hematocrit: 47.2 % (ref 37.5–51.0)
Hemoglobin: 15.5 g/dL (ref 13.0–17.7)
MCH: 28.2 pg (ref 26.6–33.0)
MCHC: 32.8 g/dL (ref 31.5–35.7)
MCV: 86 fL (ref 79–97)
Platelets: 297 10*3/uL (ref 150–450)
RBC: 5.5 x10E6/uL (ref 4.14–5.80)
RDW: 13.1 % (ref 11.6–15.4)
WBC: 4.9 10*3/uL (ref 3.4–10.8)

## 2023-09-18 LAB — VITAMIN D 25 HYDROXY (VIT D DEFICIENCY, FRACTURES): Vit D, 25-Hydroxy: 17 ng/mL — ABNORMAL LOW (ref 30.0–100.0)

## 2023-09-18 LAB — LIPID PANEL
Chol/HDL Ratio: 3.8 ratio (ref 0.0–5.0)
Cholesterol, Total: 163 mg/dL (ref 100–199)
HDL: 43 mg/dL (ref >39)
LDL Chol Calc (NIH): 104 mg/dL — ABNORMAL HIGH (ref 0–99)
Triglycerides: 86 mg/dL (ref 0–149)
VLDL Cholesterol Cal: 16 mg/dL (ref 5–40)

## 2023-09-18 LAB — COMPREHENSIVE METABOLIC PANEL WITH GFR
ALT: 18 IU/L (ref 0–44)
AST: 15 IU/L (ref 0–40)
Albumin: 4.3 g/dL (ref 4.1–5.1)
Alkaline Phosphatase: 64 IU/L (ref 44–121)
BUN/Creatinine Ratio: 11 (ref 9–20)
BUN: 14 mg/dL (ref 6–24)
Bilirubin Total: 0.9 mg/dL (ref 0.0–1.2)
CO2: 27 mmol/L (ref 20–29)
Calcium: 9.8 mg/dL (ref 8.7–10.2)
Chloride: 99 mmol/L (ref 96–106)
Creatinine, Ser: 1.23 mg/dL (ref 0.76–1.27)
Globulin, Total: 2.7 g/dL (ref 1.5–4.5)
Glucose: 87 mg/dL (ref 70–99)
Potassium: 4.2 mmol/L (ref 3.5–5.2)
Sodium: 141 mmol/L (ref 134–144)
Total Protein: 7 g/dL (ref 6.0–8.5)
eGFR: 72 mL/min/{1.73_m2} (ref >59)

## 2023-09-18 LAB — URIC ACID: Uric Acid: 8.6 mg/dL — ABNORMAL HIGH (ref 3.8–8.4)

## 2023-09-18 LAB — MICROALBUMIN / CREATININE URINE RATIO
Creatinine, Urine: 282.5 mg/dL
Microalb/Creat Ratio: 4 mg/g{creat} (ref 0–29)
Microalbumin, Urine: 11.2 ug/mL

## 2023-09-18 LAB — TSH: TSH: 1.82 u[IU]/mL (ref 0.450–4.500)

## 2023-09-18 LAB — HEMOGLOBIN A1C
Est. average glucose Bld gHb Est-mCnc: 114 mg/dL
Hgb A1c MFr Bld: 5.6 % (ref 4.8–5.6)

## 2023-09-18 LAB — PSA TOTAL (REFLEX TO FREE): Prostate Specific Ag, Serum: 0.5 ng/mL (ref 0.0–4.0)

## 2023-09-18 MED ORDER — VITAMIN D (ERGOCALCIFEROL) 1.25 MG (50000 UNIT) PO CAPS: 50000.0000 [IU] | ORAL_CAPSULE | ORAL | 0 refills | Status: DC

## 2023-09-23 ENCOUNTER — Ambulatory Visit: Admitting: Family Medicine

## 2023-09-27 ENCOUNTER — Other Ambulatory Visit: Payer: Self-pay | Admitting: Family Medicine

## 2023-09-27 DIAGNOSIS — E118 Type 2 diabetes mellitus with unspecified complications: Secondary | ICD-10-CM

## 2023-09-30 NOTE — Telephone Encounter (Signed)
 Requested Prescriptions  Pending Prescriptions Disp Refills   Semaglutide , 2 MG/DOSE, (OZEMPIC , 2 MG/DOSE,) 8 MG/3ML SOPN [Pharmacy Med Name: OZEMPIC   2MG  SOLUTION PEN-INJECTOR  PEN DOSE] 9 mL 1    Sig: INJECT SUBCUTANEOUSLY 2 MG EVERY WEEK     Endocrinology:  Diabetes - GLP-1 Receptor Agonists - semaglutide  Passed - 09/30/2023 12:10 PM      Passed - HBA1C in normal range and within 180 days    Hgb A1c MFr Bld  Date Value Ref Range Status  09/16/2023 5.6 4.8 - 5.6 % Final    Comment:             Prediabetes: 5.7 - 6.4          Diabetes: >6.4          Glycemic control for adults with diabetes: <7.0          Passed - Cr in normal range and within 360 days    Creatinine, Ser  Date Value Ref Range Status  09/16/2023 1.23 0.76 - 1.27 mg/dL Final         Passed - Valid encounter within last 6 months    Recent Outpatient Visits           2 weeks ago Healthcare maintenance   [facility] Primary Care & Sports Medicine at [address], Jason J, MD

## 2023-10-14 NOTE — Telephone Encounter (Signed)
 Erroneous- disregard

## 2023-10-19 ENCOUNTER — Encounter: Payer: Self-pay | Admitting: Family Medicine

## 2023-10-19 NOTE — Telephone Encounter (Signed)
 Please review and advise patient.   JM

## 2023-10-20 ENCOUNTER — Other Ambulatory Visit: Payer: Self-pay

## 2023-10-20 DIAGNOSIS — Z0000 Encounter for general adult medical examination without abnormal findings: Secondary | ICD-10-CM

## 2023-10-24 LAB — ABO AND RH: Rh Factor: POSITIVE

## 2023-10-26 ENCOUNTER — Ambulatory Visit: Payer: Self-pay | Admitting: Family Medicine

## 2023-11-05 ENCOUNTER — Other Ambulatory Visit: Payer: Self-pay | Admitting: Family Medicine

## 2023-11-09 NOTE — Telephone Encounter (Signed)
 Requested medication (s) are due for refill today:   Provider to review  Requested medication (s) are on the active medication list:   Yes  Future visit scheduled:   Yes 8/12   Last ordered: 09/18/2023 #8, 0 refills  Non delegated refill    Requested Prescriptions  Pending Prescriptions Disp Refills   Vitamin D , Ergocalciferol , (DRISDOL ) 1.25 MG (50000 UNIT) CAPS capsule [Pharmacy Med Name: Vitamin D  (Ergocalciferol ) 1.25 MG (50000 UT) Oral Capsule] 8 capsule 5    Sig: TAKE 1 CAPSULE BY MOUTH EVERY 7  DAYS ; TAKE FOR 8 TOTAL DOSES     Endocrinology:  Vitamins - Vitamin D  Supplementation 2 Failed - 11/09/2023  1:33 PM      Failed - Manual Review: Route requests for 50,000 IU strength to the provider      Failed - Vitamin D  in normal range and within 360 days    Vit D, 25-Hydroxy  Date Value Ref Range Status  09/16/2023 17.0 (L) 30.0 - 100.0 ng/mL Final    Comment:    Vitamin D  deficiency has been defined by the Institute of Medicine and an Endocrine Society practice guideline as a level of serum 25-OH vitamin D  less than 20 ng/mL (1,2). The Endocrine Society went on to further define vitamin D  insufficiency as a level between 21 and 29 ng/mL (2). 1. IOM (Institute of Medicine). 2010. Dietary reference    intakes for calcium and D. [place]: The    [employer]. 2. Holick MF, Binkley [place], Bischoff-Ferrari HA, et al.    Evaluation, treatment, and prevention of vitamin D     deficiency: an Endocrine Society clinical practice    guideline. JCEM. 2011 Jul; 96(7):1911-30.          Passed - Ca in normal range and within 360 days    Calcium  Date Value Ref Range Status  09/16/2023 9.8 8.7 - 10.2 mg/dL Final         Passed - Valid encounter within last 12 months    Recent Outpatient Visits           1 month ago Healthcare maintenance   [facility] Primary Care & Sports Medicine at [facility], Selinda PARAS, MD

## 2023-11-16 ENCOUNTER — Ambulatory Visit: Attending: Family Medicine

## 2023-11-16 DIAGNOSIS — R531 Weakness: Secondary | ICD-10-CM | POA: Diagnosis not present

## 2023-11-16 DIAGNOSIS — M25552 Pain in left hip: Secondary | ICD-10-CM | POA: Insufficient documentation

## 2023-11-16 DIAGNOSIS — M25651 Stiffness of right hip, not elsewhere classified: Secondary | ICD-10-CM | POA: Insufficient documentation

## 2023-11-16 DIAGNOSIS — M25652 Stiffness of left hip, not elsewhere classified: Secondary | ICD-10-CM | POA: Insufficient documentation

## 2023-11-16 DIAGNOSIS — M6281 Muscle weakness (generalized): Secondary | ICD-10-CM | POA: Diagnosis present

## 2023-11-16 NOTE — Therapy (Unsigned)
 OUTPATIENT PHYSICAL THERAPY HIP EVALUATION   Patient Name: Lem Peary MRN: [id number] DOB:[birth year], 48 y.o., male Today's Date: 11/17/2023  END OF SESSION:  PT End of Session - 11/16/23 1405     Visit Number 1    Number of Visits 13    Date for PT Re-Evaluation 12/28/23    Authorization Type eval: 11/16/23    PT Start Time 1410    PT Stop Time 1505    PT Time Calculation (min) 55 min    Activity Tolerance Patient tolerated treatment well    Behavior During Therapy [facility] for tasks assessed/performed         Past Medical History:  Diagnosis Date   Adenomatous polyps 06/07/2018   Formatting of this note might be different from the original.  Added automatically from request for surgery [id number]     Allergy    Bronchitis 05/25/2018   Last Assessment & Plan:  Formatting of this note might be different from the original. Reviewed hx, sx, exam with pt Proceed with antibx With recent doxycycline and allergies, will proceed with rx levofloxacin. Also included short burst of po steroid. If symptoms not improved in next 3-5 days with treatment/plan recommended, patient was advised to follow up with office for further recommendations.   Cholecystitis    Choledocholithiasis 04/30/2019   Deviated septum    GERD (gastroesophageal reflux disease)    Gout    H/O colonoscopy with polypectomy 05/07/2018   Formatting of this note might be different from the original.  Last Assessment & Plan:   Formatting of this note might be different from the original.  Refer for consideration for repeat colonoscopy  Last Assessment & Plan:   Formatting of this note might be different from the original.  Refer for consideration for repeat colonoscopy     Hypertension    Left Achilles tendinitis 11/04/2018   Last Assessment & Plan:  Formatting of this note might be different from the original. 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 This appears benign just monitor.   Past Surgical History:  Procedure Laterality Date   CHOLECYSTECTOMY  2020   WISDOM TOOTH EXTRACTION     Patient Active Problem List   Diagnosis Date Noted   Greater trochanteric pain syndrome of left lower extremity 09/16/2023   Great toe pain, left 09/16/2023   Long-term (current) use of injectable non-insulin antidiabetic drugs 09/16/2023   Peroneal tendonitis of left lower leg 12/16/2022   Class 2 obesity with alveolar hypoventilation, serious comorbidity, and body mass index (BMI) of 39.0 to 39.9 in adult (HCC) 12/10/2022   Toe pain, right 12/10/2022   Mass of lower outer quadrant of left breast 09/10/2022   Subareolar mass of right breast 09/10/2022   Healthcare maintenance 09/25/2021   OSA on CPAP 05/30/2021   Allergic rhinitis 05/30/2021   Chronic left-sided lumbar radiculopathy 05/30/2021   Other constipation 05/05/2019   Type 2 diabetes mellitus with complication (HCC) 11/04/2018   Colon polyp 05/07/2018   Dyslipidemia 05/07/2018   Primary hypertension 05/07/2018   Chronic rhinitis 04/13/2011   PCP: Alvia Selinda PARAS, MD  REFERRING PROVIDER: Alvia Selinda PARAS, MD  REFERRING DIAG: [phone] (ICD-10-CM) - Greater trochanteric pain syndrome of left lower extremity   RATIONALE FOR EVALUATION  AND TREATMENT: Rehabilitation  THERAPY DIAG: Pain in left hip  Stiffness of left hip, not elsewhere classified  Muscle weakness (generalized)  ONSET DATE: March-April 2025  FOLLOW-UP APPT SCHEDULED WITH REFERRING PROVIDER: No    SUBJECTIVE:                                                                                                                                                                                         SUBJECTIVE STATEMENT:  I just wanna be able to play with my baby  and move around without my hip hurting me  PERTINENT HISTORY:  Pt is an expectant father with his first child to be born in late November of 2025. Pt's goal is to be able to help care for his son once he is born without having pain in his hip. Pt is moving to [place] to be with his fiance and unborn son in August.   Describes his L hip pain as an aching tension/tightness that comes and goes. Pt notices the pain the most when standing on hard or uneven surfaces. Pt reports taking 1 of the prescribed Celebrex  every morning to help with pain relief. Pt says his pain is only located in the lateral hip and does not radiate or refer. He reports no change in sensation and no numbness or tingling.    From Visit with Dr. Alvia on 09/16/23:  He experiences a 'twinge' and tightness in his left hip, particularly when standing up. The pain is located on the outer hip. He suspects his sleeping position may exacerbate the pain. He has not been engaging in specific exercises that might alleviate the issue, such as those from his football days.   Left greater trochanteric pain syndrome-trochanteric bursitis  Left hip pain due to greater trochanteric pain syndrome with muscular imbalance. Symptoms worsen with contact and movement. - Prescribe Celebrex  2 mg anti-inflammatory medication for hip pain.  Take 1-2 times daily on an as-needed basis for pain, take with food. - Refer to physical therapy for targeted exercises. - Consider ultrasound-guided injection if symptoms persist. - Encourage home exercises to strengthen hip stabilizers  PAIN:    Pain Intensity: Present: 4/10, Best: 1/10, Worst: 7/10 Pain location: lateral L hip  Pain Quality: intermittent, aching, and tightness  Radiating: No  Numbness/Tingling: No Focal Weakness: No Aggravating factors: when his achilles tendinitis flares his hip pain flares, not wearing comfy shoes, standing on hard surfaces for long periods of time, standing on uneven  surfaces; Relieving factors: Celebrex , rest 24-hour pain behavior: depends on the activity of the day, usually tighter in the morning How long can you sit: 5 hours How  long can you stand: 20-30 mins History of prior back or hip injury, pain, surgery, or therapy: Yes; PT for L lumbar radiculopathy Spring 2023 Dominant hand: right Imaging: No  Red flags: Negative for bowel/bladder changes, saddle paresthesia, testicular pain, h/o kidney stones, h/o spinal tumors, h/o compression fx, personal history of cancer/malignancy (especially prostate or pelvic), acute hip trauma, night pain, h/o abdominal aneurysm, abdominal pain, chills/fever, night sweats, nausea, vomiting, diarrhea, unexplained weight gain/loss;  PRECAUTIONS: None  WEIGHT BEARING RESTRICTIONS: No  FALLS: Has patient fallen in last 6 months? 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  Recent memory is intact.  Remote memory is intact.  Attention span and concentration are intact.  Expressive speech is intact.  Patient's fund of knowledge is within normal limits for educational level.    Gross  Musculoskeletal Assessment Tremor: None; Bulk: Normal, no muscle wasting noted; Tone: Normal;  GAIT: Distance walked: 100 ft Assistive device utilized: None Level of assistance: Complete Independence Comments: wide BOS with bilat hip ER (toe-out)  Posture: Mild kyphotic sitting posture Bilat hip ER in standing (toe-out) Widened BOS  AROM AROM (Normal range in degrees) AROM   Hip Right Left  Flexion (125) 93 93  Extension (15)    Abduction (40) 32 40  Adduction (30) 26 30  Internal Rotation (45) 19 18  External Rotation (45) 41 37      Knee    Flexion (135) WNL WNL  Extension (0) WNL WNL  (* = pain; Blank rows = not tested)  LE MMT: MMT (out of 5) Right  Left   Hip flexion 4/5 4/5*  Hip extension 4+/5 4+/5  Hip abduction 4/5 4/5  Hip adduction 4/5 4/5  Hip internal rotation    Hip external rotation (seated) 4/5 4/5  Knee flexion 4+/5 4+/5  Knee extension 5/5 5/5  Ankle dorsiflexion 5/5 5/5  Ankle plantarflexion Active Active  Ankle inversion    Ankle eversion    (* = pain; Blank rows = not tested)  Sensation Grossly intact to light touch throughout bilateral LEs as determined by testing dermatomes L2-S2. Proprioception, stereognosis, and hot/cold testing deferred on this date.  Reflexes Not tested  Muscle Length Not tested  Palpation Location Right Left         Lumbar paraspinals 0 0  Quadratus Lumborum 0 0  Iliac Crest 0 0  ASIS    Pubic Rami    Pubic symphysis   Femoral Triangle    Inguinal ligament    Gluteus Maximus    Gluteus Medius  1  Deep hip external rotators    Sacrum   PSIS    Fortin's Area (SIJ)  Coccyx   Ischial Tuberosity    Greater Trochanter  1  (Blank rows = not tested) Graded on 0-4 scale (0 = no pain, 1 = pain, 2 = pain with wincing/grimacing/flinching, 3 = pain with withdrawal, 4 = unwilling to allow palpation)  Passive Accessory Intervertebral Motion Not Tested  Special Tests Lumbar Radiculopathy and  Discogenic: Centralization and Peripheralization (SN 92, -LR 0.12): Not done Slump (SN 83, -LR 0.32): R: Negative L: Negative SLR (SN 92, -LR 0.29): R: Not done L:  Not done  Hip: FABER (SN 81): R: Negative L: Negative FADIR (SN 94): R: Negative L: Negative Hip scour (SN 50): R: Negative L: Negative   Physical Performance Measures Deferred this session   TODAY'S TREATMENT: Deferred to next session   PATIENT EDUCATION:  Education details: Dx and POC Person educated: Patient Education method: Explanation and Pictures Education comprehension: verbalized understanding   HOME EXERCISE PROGRAM:  Deferred to next session   ASSESSMENT:  CLINICAL IMPRESSION: Patient is a 48 y.o. male who was seen today for physical therapy evaluation and treatment for L lateral hip pain. Pt has PMH of DMT2, HTN, OSA, Obesity, and dyslipidemia. Pt also has PMH of L sciatica and L Achilles tendinitis. Pt presents with decreased strength with bilat hip ABD, hip ER, and hip flexion. Pt presents with limited AROM in bilat hip flexion, IR, and ER, and in R ABD and ADD. Pt describes his pain as a tight aching feeling that will come and go dependent on his physical activity for the day. He that the worst aggravating factor is standing/walking on uneven or hard surfaces for extended periods of time. Pt is TTP over the L greater trochanter and gluteus medius muscle and insertion. Pt is an expectant father with his first child to be born in late November of 2025. His goal is to be able to help care for his son once he is born without having pain in his hip. Pt would also like to decrease his hip pain so he can form a good fitness routine for weight loss. Reviewed POC with patient and he was encouraged to follow-up as scheduled. Pt will benefit from PT services to address deficits in strength, ROM, and general mobility in order to return to his PLOF and be able to perform care-taking tasks once his son is born.     OBJECTIVE IMPAIRMENTS: Abnormal gait, decreased activity tolerance, decreased mobility, difficulty walking, decreased ROM, decreased strength, obesity, and pain.   ACTIVITY LIMITATIONS: carrying, lifting, bending, standing, squatting, and locomotion level  PARTICIPATION LIMITATIONS: cleaning, driving, shopping, community activity, and occupation  PERSONAL FACTORS: 3+ comorbidities: HTN, DMT2, OSA, Dyslipidemia, and Obesity are also affecting patient's functional outcome.   REHAB POTENTIAL: Good  CLINICAL DECISION MAKING: Stable/uncomplicated  EVALUATION COMPLEXITY: Low   GOALS: Goals reviewed with patient? Yes  SHORT TERM GOALS: Target date: 11/30/2023  Pt will be independent with HEP in order to improve strength and decrease hip pain to improve pain-free function at home and work. Baseline: HEP to be given next visit Goal status: INITIAL   LONG TERM GOALS: Target date: 12/28/2023   1.  Pt will decrease worst hip pain by at least 3 points on the NPRS in order to demonstrate clinically significant reduction in hip pain. Baseline: 11/16/23: 7/10 Goal status: INITIAL  3.  Pt will decrease HAGOS score by at least 20 points in order demonstrate clinically significant reduction in hip pain/disability.       Baseline: 11/16/23: 60.6% Goal status:  INITIAL  4. Pt will increase hip flexion, hip ABD, and hip ER strength by at least 1/2 MMT grade         in order to demonstrate improvement in strength and function.     Baseline: 11/16/23: 4/5     Goal status: INITIAL   PLAN: PT FREQUENCY: 1-2x/week  PT DURATION: 6 weeks  PLANNED INTERVENTIONS: Therapeutic exercises, Therapeutic activity, Neuromuscular re-education, Balance training, Gait training, Patient/Family education, Self Care, Joint mobilization, Joint manipulation, Vestibular training, Canalith repositioning, Orthotic/Fit training, DME instructions, Dry Needling, Electrical stimulation, Spinal manipulation, Spinal  mobilization, Cryotherapy, Moist heat, Taping, Traction, Ultrasound, Ionotophoresis 4mg /ml Dexamethasone, Manual therapy, and Re-evaluation.  PLAN FOR NEXT SESSION: functional test, isometrics, HEP, work ergonomics education   Vernell Moats, SPT Jason D Huprich PT, DPT, GCS  Huprich,Jason, PT 11/17/2023, 9:05 AM

## 2023-11-18 ENCOUNTER — Encounter

## 2023-11-19 ENCOUNTER — Encounter

## 2023-11-22 ENCOUNTER — Other Ambulatory Visit: Payer: Self-pay | Admitting: Family Medicine

## 2023-11-23 ENCOUNTER — Ambulatory Visit

## 2023-11-23 DIAGNOSIS — M25652 Stiffness of left hip, not elsewhere classified: Secondary | ICD-10-CM

## 2023-11-23 DIAGNOSIS — M25552 Pain in left hip: Secondary | ICD-10-CM | POA: Diagnosis not present

## 2023-11-23 DIAGNOSIS — M6281 Muscle weakness (generalized): Secondary | ICD-10-CM

## 2023-11-23 NOTE — Therapy (Unsigned)
 OUTPATIENT PHYSICAL THERAPY HIP TREATMENT   Patient Name: Sabastian Raimondi MRN: [id number] DOB:27-Mar-1976, 48 y.o., male Today's Date: 11/24/2023  END OF SESSION:  PT End of Session - 11/23/23 1407     Visit Number 2    Number of Visits 13    Date for PT Re-Evaluation 12/28/23    Authorization Type eval: 11/16/23    PT Start Time 1405    PT Stop Time 1500    PT Time Calculation (min) 55 min    Activity Tolerance Patient tolerated treatment well    Behavior During Therapy [facility] for tasks assessed/performed         Past Medical History:  Diagnosis Date   Adenomatous polyps 06/07/2018   Formatting of this note might be different from the original.  Added automatically from request for surgery [id number]     Allergy    Bronchitis 05/25/2018   Last Assessment & Plan:  Formatting of this note might be different from the original. Reviewed hx, sx, exam with pt Proceed with antibx With recent doxycycline and allergies, will proceed with rx levofloxacin. Also included short burst of po steroid. If symptoms not improved in next 3-5 days with treatment/plan recommended, patient was advised to follow up with office for further recommendations.   Cholecystitis    Choledocholithiasis 04/30/2019   Deviated septum    GERD (gastroesophageal reflux disease)    Gout    H/O colonoscopy with polypectomy 05/07/2018   Formatting of this note might be different from the original.  Last Assessment & Plan:   Formatting of this note might be different from the original.  Refer for consideration for repeat colonoscopy  Last Assessment & Plan:   Formatting of this note might be different from the original.  Refer for consideration for repeat colonoscopy     Hypertension    Left Achilles tendinitis 11/04/2018   Last Assessment & Plan:  Formatting of this note might be different from the original. 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 This appears benign just monitor.   Past Surgical History:  Procedure Laterality Date   CHOLECYSTECTOMY  2020   WISDOM TOOTH EXTRACTION     Patient Active Problem List   Diagnosis Date Noted   Greater trochanteric pain syndrome of left lower extremity 09/16/2023   Great toe pain, left 09/16/2023   Long-term (current) use of injectable non-insulin antidiabetic drugs 09/16/2023   Peroneal tendonitis of left lower leg 12/16/2022   Class 2 obesity with alveolar hypoventilation, serious comorbidity, and body mass index (BMI) of 39.0 to 39.9 in adult (HCC) 12/10/2022   Toe pain, right 12/10/2022   Mass of lower outer quadrant of left breast 09/10/2022   Subareolar mass of right breast 09/10/2022   Healthcare maintenance 09/25/2021   OSA on CPAP 05/30/2021   Allergic rhinitis 05/30/2021   Chronic left-sided lumbar radiculopathy 05/30/2021   Other constipation 05/05/2019   Type 2 diabetes mellitus with complication (HCC) 11/04/2018   Colon polyp 05/07/2018   Dyslipidemia 05/07/2018   Primary hypertension 05/07/2018   Chronic rhinitis 04/13/2011   PCP: Alvia Selinda PARAS, MD  REFERRING PROVIDER: Alvia Selinda PARAS, MD  REFERRING DIAG: [phone] (ICD-10-CM) - Greater trochanteric pain syndrome of left lower extremity   RATIONALE FOR EVALUATION  AND TREATMENT: Rehabilitation  THERAPY DIAG: Pain in left hip  Stiffness of left hip, not elsewhere classified  Muscle weakness (generalized)  ONSET DATE: March-April 2025  FOLLOW-UP APPT SCHEDULED WITH REFERRING PROVIDER: No   FROM EVAL 11/16/23:  SUBJECTIVE:                                                                                                                                                                                         SUBJECTIVE STATEMENT:  I just wanna be able to  play with my baby and move around without my hip hurting me  PERTINENT HISTORY:  Pt is an expectant father with his first child to be born in late November of 2025. Pt's goal is to be able to help care for his son once he is born without having pain in his hip. Pt is moving to [place] to be with his fiance and unborn son in August.   Describes his L hip pain as an aching tension/tightness that comes and goes. Pt notices the pain the most when standing on hard or uneven surfaces. Pt reports taking 1 of the prescribed Celebrex  every morning to help with pain relief. Pt says his pain is only located in the lateral hip and does not radiate or refer. He reports no change in sensation and no numbness or tingling.    From Visit with Dr. Alvia on 09/16/23:  He experiences a 'twinge' and tightness in his left hip, particularly when standing up. The pain is located on the outer hip. He suspects his sleeping position may exacerbate the pain. He has not been engaging in specific exercises that might alleviate the issue, such as those from his football days.   Left greater trochanteric pain syndrome-trochanteric bursitis  Left hip pain due to greater trochanteric pain syndrome with muscular imbalance. Symptoms worsen with contact and movement. - Prescribe Celebrex  2 mg anti-inflammatory medication for hip pain.  Take 1-2 times daily on an as-needed basis for pain, take with food. - Refer to physical therapy for targeted exercises. - Consider ultrasound-guided injection if symptoms persist. - Encourage home exercises to strengthen hip stabilizers  PAIN:    Pain Intensity: Present: 4/10, Best: 1/10, Worst: 7/10 Pain location: lateral L hip  Pain Quality: intermittent, aching, and tightness  Radiating: No  Numbness/Tingling: No Focal Weakness: No Aggravating factors: when his achilles tendinitis flares his hip pain flares, not wearing comfy shoes, standing on hard surfaces for long periods of time, standing  on uneven surfaces; Relieving factors: Celebrex , rest 24-hour pain behavior: depends on the activity of the day, usually tighter in the morning How long can you sit:  5 hours How long can you stand: 20-30 mins History of prior back or hip injury, pain, surgery, or therapy: Yes; PT for L lumbar radiculopathy Spring 2023 Dominant hand: right Imaging: No  Red flags: Negative for bowel/bladder changes, saddle paresthesia, testicular pain, h/o kidney stones, h/o spinal tumors, h/o compression fx, personal history of cancer/malignancy (especially prostate or pelvic), acute hip trauma, night pain, h/o abdominal aneurysm, abdominal pain, chills/fever, night sweats, nausea, vomiting, diarrhea, unexplained weight gain/loss;  PRECAUTIONS: None  WEIGHT BEARING RESTRICTIONS: No  FALLS: Has patient fallen in last 6 months? No  Living Environment Lives with: lives alone now, but moving to 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  Recent memory is intact.  Remote memory is intact.  Attention span and concentration are intact.  Expressive speech is intact.  Patient's fund of knowledge is within normal limits for educational  level.    Gross Musculoskeletal Assessment Tremor: None; Bulk: Normal, no muscle wasting noted; Tone: Normal;  GAIT: Distance walked: 100 ft Assistive device utilized: None Level of assistance: Complete Independence Comments: wide BOS with bilat hip ER (toe-out)  Posture: Mild kyphotic sitting posture Bilat hip ER in standing (toe-out) Widened BOS  AROM AROM (Normal range in degrees) AROM   Hip Right Left  Flexion (125) 93 93  Extension (15)    Abduction (40) 32 40  Adduction (30) 26 30  Internal Rotation (45) 19 18  External Rotation (45) 41 37      Knee    Flexion (135) WNL WNL  Extension (0) WNL WNL  (* = pain; Blank rows = not tested)  LE MMT: MMT (out of 5) Right  Left   Hip flexion 4/5 4/5*  Hip extension 4+/5 4+/5  Hip abduction 4/5 4/5  Hip adduction 4/5 4/5  Hip internal rotation    Hip external rotation (seated) 4/5 4/5  Knee flexion 4+/5 4+/5  Knee extension 5/5 5/5  Ankle dorsiflexion 5/5 5/5  Ankle plantarflexion Active Active  Ankle inversion    Ankle eversion    (* = pain; Blank rows = not tested)  Sensation Grossly intact to light touch throughout bilateral LEs as determined by testing dermatomes L2-S2. Proprioception, stereognosis, and hot/cold testing deferred on this date.  Reflexes Not tested  Muscle Length Not tested  Palpation Location Right Left         Lumbar paraspinals 0 0  Quadratus Lumborum 0 0  Iliac Crest 0 0  ASIS    Pubic Rami    Pubic symphysis   Femoral Triangle    Inguinal ligament    Gluteus Maximus    Gluteus Medius  1  Deep hip external rotators    Sacrum   PSIS    Fortin's  Area (SIJ)    Coccyx   Ischial Tuberosity    Greater Trochanter  1  (Blank rows = not tested) Graded on 0-4 scale (0 = no pain, 1 = pain, 2 = pain with wincing/grimacing/flinching, 3 = pain with withdrawal, 4 = unwilling to allow palpation)  Passive Accessory Intervertebral Motion Not Tested  Special Tests Lumbar  Radiculopathy and Discogenic: Centralization and Peripheralization (SN 92, -LR 0.12): Not done Slump (SN 83, -LR 0.32): R: Negative L: Negative SLR (SN 92, -LR 0.29): R: Not done L:  Not done  Hip: FABER (SN 81): R: Negative L: Negative FADIR (SN 94): R: Negative L: Negative Hip scour (SN 50): R: Negative L: Negative   Physical Performance Measures Deferred this session    TODAY'S TREATMENT: 11/23/23  SUBJECTIVE: Pt reports that he is doing well today. No changes since the initial evaluation/last therapy session. Denies pain. No specific questions or concerns.    PAIN: Denies   OBJECTIVE:   Ther-ex  Nustep lvl 1-4, 10 minutes for BLE strengthening and warm-up during interval history (5 mins unbilled);  Isometric supine SLR against SPT manual resistance 5 x 10; Isometric supine hip ABD against SPT manual resistance 5 x 10; Supine bridges 2 x 10; Supine ADD ball squeeze 2 x 10 x 5;  Isometric supine hip ext into mat table 5 x 10;  Supine Clam 2 x 10 x 5;  Ther-Act Standing marching with 5 # AW 2 x 10 BLE;  Standing hamstring curls with 5 # AW 2 x 10 BLE;  Forwards monster walks with black TB 2 x 50 ft;  Backwards monster walks with black TB 2 x 50 ft;  Lateral Banded walks with black TB 4 x 50 ft;    PATIENT EDUCATION:  Education details: HEP and POC Person educated: Patient Education method: Explanation and demonstration Education comprehension: verbalized understanding and returned demonstration  HOME EXERCISE PROGRAM:  Access Code: [email or web address] URL: [email or web address] Date: 11/23/2023 Prepared by: Selinda Eck  Exercises - Hooklying Isometric Hip Abduction with Belt  - 2 x daily - 7 x weekly - 1 sets - 10 reps - 10s hold - Hooklying Isometric Hip Flexion  - 2 x daily - 7 x weekly - 1 sets - 10 reps - 10s hold - Supine 90/90 Isometric Hip Extension  - 2 x daily - 7 x weekly - 1 sets - 10 reps - 10s hold - Forward Monster Walks  - 1 x daily  - 7 x weekly - Backward Monster Walks  - 1 x daily - 7 x weekly - Side Stepping with Resistance at Ankles  - 1 x daily - 7 x weekly - Mini Squat with Counter Support  - 2 x daily - 7 x weekly - 2 sets - 10 reps - Standing 3-Way Leg Reach with Resistance at Ankles and Counter Support  - 2 x daily - 7 x weekly - 2 sets - 10 reps   ASSESSMENT:  CLINICAL IMPRESSION: Initiated strength exercises during session today with patient. Isometric exercises were added to help strengthen the hip muscles with minimal load on the gluteal tendons. Banded exercises were done for dynamic functional strengthening in a standing position and during ambulation. Issued HEP and reviewed with patient. Pt encouraged to follow-up as scheduled. Pt will benefit from PT services to address deficits in strength, ROM, and general mobility in order to return to his PLOF and be able to perform care-taking tasks once his son is born.  OBJECTIVE IMPAIRMENTS: Abnormal gait, decreased activity tolerance, decreased mobility, difficulty walking, decreased ROM, decreased strength, obesity, and pain.   ACTIVITY LIMITATIONS: carrying, lifting, bending, standing, squatting, and locomotion level  PARTICIPATION LIMITATIONS: cleaning, driving, shopping, community activity, and occupation  PERSONAL FACTORS: 3+ comorbidities: HTN, DMT2, OSA, Dyslipidemia, and Obesity are also affecting patient's functional outcome.   REHAB POTENTIAL: Good  CLINICAL DECISION MAKING: Stable/uncomplicated  EVALUATION COMPLEXITY: Low   GOALS: Goals reviewed with patient? Yes  SHORT TERM GOALS: Target date: 11/30/2023  Pt will be independent with HEP in order to improve strength and decrease hip pain to improve pain-free function at home and work. Baseline: 11/23/23: HEP given  Goal status: INITIAL   LONG TERM GOALS: Target date: 12/28/2023   1.  Pt will decrease worst hip pain by at least 3 points on the NPRS in order to demonstrate clinically  significant reduction in hip pain. Baseline: 11/16/23: 7/10 Goal status: INITIAL  3.  Pt will decrease HAGOS score by at least 20 points in order demonstrate clinically significant reduction in hip pain/disability.       Baseline: 11/16/23: 60.6% Goal status: INITIAL  4. Pt will increase hip flexion, hip ABD, and hip ER strength by at least 1/2 MMT grade         in order to demonstrate improvement in strength and function.     Baseline: 11/16/23: 4/5     Goal status: INITIAL   PLAN: PT FREQUENCY: 1-2x/week  PT DURATION: 6 weeks  PLANNED INTERVENTIONS: Therapeutic exercises, Therapeutic activity, Neuromuscular re-education, Balance training, Gait training, Patient/Family education, Self Care, Joint mobilization, Joint manipulation, Vestibular training, Canalith repositioning, Orthotic/Fit training, DME instructions, Dry Needling, Electrical stimulation, Spinal manipulation, Spinal mobilization, Cryotherapy, Moist heat, Taping, Traction, Ultrasound, Ionotophoresis 4mg /ml Dexamethasone, Manual therapy, and Re-evaluation.  PLAN FOR NEXT SESSION: Stairs, continue isometrics, check HEP progress, work Financial controller education   Vernell Moats, SPT Jason D Huprich PT, DPT, [facility]  Huprich,Jason, PT 11/24/2023, 8:17 AM

## 2023-11-24 NOTE — Telephone Encounter (Signed)
 Requested Prescriptions  Pending Prescriptions Disp Refills   celecoxib  (CELEBREX ) 200 MG capsule [Pharmacy Med Name: Celecoxib  200 MG Oral Capsule] 60 capsule 2    Sig: TAKE 1 CAPSULE BY MOUTH TWICE  DAILY AS NEEDED     Analgesics:  COX2 Inhibitors Failed - 11/24/2023 11:59 AM      Failed - Manual Review: Labs are only required if the patient has taken medication for more than 8 weeks.      Passed - HGB in normal range and within 360 days    Hemoglobin  Date Value Ref Range Status  09/16/2023 15.5 13.0 - 17.7 g/dL Final         Passed - Cr in normal range and within 360 days    Creatinine, Ser  Date Value Ref Range Status  09/16/2023 1.23 0.76 - 1.27 mg/dL Final         Passed - HCT in normal range and within 360 days    Hematocrit  Date Value Ref Range Status  09/16/2023 47.2 37.5 - 51.0 % Final         Passed - AST in normal range and within 360 days    AST  Date Value Ref Range Status  09/16/2023 15 0 - 40 IU/L Final         Passed - ALT in normal range and within 360 days    ALT  Date Value Ref Range Status  09/16/2023 18 0 - 44 IU/L Final         Passed - eGFR is 30 or above and within 360 days    eGFR  Date Value Ref Range Status  09/16/2023 72 >59 mL/min/1.73 Final         Passed - Patient is not pregnant      Passed - Valid encounter within last 12 months    Recent Outpatient Visits           2 months ago Healthcare maintenance   [facility] at [facility], Selinda PARAS, MD

## 2023-11-25 ENCOUNTER — Encounter

## 2023-11-26 ENCOUNTER — Ambulatory Visit

## 2023-11-26 DIAGNOSIS — M25552 Pain in left hip: Secondary | ICD-10-CM | POA: Diagnosis not present

## 2023-11-26 DIAGNOSIS — M6281 Muscle weakness (generalized): Secondary | ICD-10-CM

## 2023-11-26 DIAGNOSIS — M25652 Stiffness of left hip, not elsewhere classified: Secondary | ICD-10-CM

## 2023-11-26 NOTE — Therapy (Unsigned)
 OUTPATIENT PHYSICAL THERAPY HIP TREATMENT   Patient Name: Cotey Rakes MRN: [id number] DOB:25-Sep-1975, 48 y.o., male Today's Date: 11/27/2023  END OF SESSION:  PT End of Session - 11/26/23 1446     Visit Number 3    Number of Visits 13    Date for PT Re-Evaluation 12/28/23    Authorization Type eval: 11/16/23    PT Start Time 1444    PT Stop Time 1532    PT Time Calculation (min) 48 min    Activity Tolerance Patient tolerated treatment well    Behavior During Therapy [facility] for tasks assessed/performed         Past Medical History:  Diagnosis Date   Adenomatous polyps 06/07/2018   Formatting of this note might be different from the original.  Added automatically from request for surgery [id number]     Allergy    Bronchitis 05/25/2018   Last Assessment & Plan:  Formatting of this note might be different from the original. Reviewed hx, sx, exam with pt Proceed with antibx With recent doxycycline and allergies, will proceed with rx levofloxacin. Also included short burst of po steroid. If symptoms not improved in next 3-5 days with treatment/plan recommended, patient was advised to follow up with office for further recommendations.   Cholecystitis    Choledocholithiasis 04/30/2019   Deviated septum    GERD (gastroesophageal reflux disease)    Gout    H/O colonoscopy with polypectomy 05/07/2018   Formatting of this note might be different from the original.  Last Assessment & Plan:   Formatting of this note might be different from the original.  Refer for consideration for repeat colonoscopy  Last Assessment & Plan:   Formatting of this note might be different from the original.  Refer for consideration for repeat colonoscopy     Hypertension    Left Achilles tendinitis 11/04/2018   Last Assessment & Plan:  Formatting of this note might be different from the original. 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 This appears benign just monitor.   Past Surgical History:  Procedure Laterality Date   CHOLECYSTECTOMY  2020   WISDOM TOOTH EXTRACTION     Patient Active Problem List   Diagnosis Date Noted   Greater trochanteric pain syndrome of left lower extremity 09/16/2023   Great toe pain, left 09/16/2023   Long-term (current) use of injectable non-insulin antidiabetic drugs 09/16/2023   Peroneal tendonitis of left lower leg 12/16/2022   Class 2 obesity with alveolar hypoventilation, serious comorbidity, and body mass index (BMI) of 39.0 to 39.9 in adult (HCC) 12/10/2022   Toe pain, right 12/10/2022   Mass of lower outer quadrant of left breast 09/10/2022   Subareolar mass of right breast 09/10/2022   Healthcare maintenance 09/25/2021   OSA on CPAP 05/30/2021   Allergic rhinitis 05/30/2021   Chronic left-sided lumbar radiculopathy 05/30/2021   Other constipation 05/05/2019   Type 2 diabetes mellitus with complication (HCC) 11/04/2018   Colon polyp 05/07/2018   Dyslipidemia 05/07/2018   Primary hypertension 05/07/2018   Chronic rhinitis 04/13/2011   PCP: Alvia Selinda PARAS, MD  REFERRING PROVIDER: Alvia Selinda PARAS, MD  REFERRING DIAG: [phone] (ICD-10-CM) - Greater trochanteric pain syndrome of left lower extremity   RATIONALE FOR EVALUATION  AND TREATMENT: Rehabilitation  THERAPY DIAG: Pain in left hip  Stiffness of left hip, not elsewhere classified  Muscle weakness (generalized)  ONSET DATE: March-April 2025  FOLLOW-UP APPT SCHEDULED WITH REFERRING PROVIDER: No   FROM EVAL 11/16/23:  SUBJECTIVE:                                                                                                                                                                                         SUBJECTIVE STATEMENT:  I just wanna be able to  play with my baby and move around without my hip hurting me  PERTINENT HISTORY:  Pt is an expectant father with his first child to be born in late November of 2025. Pt's goal is to be able to help care for his son once he is born without having pain in his hip. Pt is moving to [place] to be with his fiance and unborn son in August.   Describes his L hip pain as an aching tension/tightness that comes and goes. Pt notices the pain the most when standing on hard or uneven surfaces. Pt reports taking 1 of the prescribed Celebrex  every morning to help with pain relief. Pt says his pain is only located in the lateral hip and does not radiate or refer. He reports no change in sensation and no numbness or tingling.    From Visit with Dr. Alvia on 09/16/23:  He experiences a 'twinge' and tightness in his left hip, particularly when standing up. The pain is located on the outer hip. He suspects his sleeping position may exacerbate the pain. He has not been engaging in specific exercises that might alleviate the issue, such as those from his football days.   Left greater trochanteric pain syndrome-trochanteric bursitis  Left hip pain due to greater trochanteric pain syndrome with muscular imbalance. Symptoms worsen with contact and movement. - Prescribe Celebrex  2 mg anti-inflammatory medication for hip pain.  Take 1-2 times daily on an as-needed basis for pain, take with food. - Refer to physical therapy for targeted exercises. - Consider ultrasound-guided injection if symptoms persist. - Encourage home exercises to strengthen hip stabilizers  PAIN:    Pain Intensity: Present: 4/10, Best: 1/10, Worst: 7/10 Pain location: lateral L hip  Pain Quality: intermittent, aching, and tightness  Radiating: No  Numbness/Tingling: No Focal Weakness: No Aggravating factors: when his achilles tendinitis flares his hip pain flares, not wearing comfy shoes, standing on hard surfaces for long periods of time, standing  on uneven surfaces; Relieving factors: Celebrex , rest 24-hour pain behavior: depends on the activity of the day, usually tighter in the morning How long can you sit:  5 hours How long can you stand: 20-30 mins History of prior back or hip injury, pain, surgery, or therapy: Yes; PT for L lumbar radiculopathy Spring 2023 Dominant hand: right Imaging: No  Red flags: Negative for bowel/bladder changes, saddle paresthesia, testicular pain, h/o kidney stones, h/o spinal tumors, h/o compression fx, personal history of cancer/malignancy (especially prostate or pelvic), acute hip trauma, night pain, h/o abdominal aneurysm, abdominal pain, chills/fever, night sweats, nausea, vomiting, diarrhea, unexplained weight gain/loss;  PRECAUTIONS: None  WEIGHT BEARING RESTRICTIONS: No  FALLS: Has patient fallen in last 6 months? 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  Recent memory is intact.  Remote memory is intact.  Attention span and concentration are intact.  Expressive speech is intact.  Patient's fund of knowledge is within normal limits for educational  level.    Gross Musculoskeletal Assessment Tremor: None; Bulk: Normal, no muscle wasting noted; Tone: Normal;  GAIT: Distance walked: 100 ft Assistive device utilized: None Level of assistance: Complete Independence Comments: wide BOS with bilat hip ER (toe-out)  Posture: Mild kyphotic sitting posture Bilat hip ER in standing (toe-out) Widened BOS  AROM AROM (Normal range in degrees) AROM   Hip Right Left  Flexion (125) 93 93  Extension (15)    Abduction (40) 32 40  Adduction (30) 26 30  Internal Rotation (45) 19 18  External Rotation (45) 41 37      Knee    Flexion (135) WNL WNL  Extension (0) WNL WNL  (* = pain; Blank rows = not tested)  LE MMT: MMT (out of 5) Right  Left   Hip flexion 4/5 4/5*  Hip extension 4+/5 4+/5  Hip abduction 4/5 4/5  Hip adduction 4/5 4/5  Hip internal rotation    Hip external rotation (seated) 4/5 4/5  Knee flexion 4+/5 4+/5  Knee extension 5/5 5/5  Ankle dorsiflexion 5/5 5/5  Ankle plantarflexion Active Active  Ankle inversion    Ankle eversion    (* = pain; Blank rows = not tested)  Sensation Grossly intact to light touch throughout bilateral LEs as determined by testing dermatomes L2-S2. Proprioception, stereognosis, and hot/cold testing deferred on this date.  Reflexes Not tested  Muscle Length Not tested  Palpation Location Right Left         Lumbar paraspinals 0 0  Quadratus Lumborum 0 0  Iliac Crest 0 0  ASIS    Pubic Rami    Pubic symphysis   Femoral Triangle    Inguinal ligament    Gluteus Maximus    Gluteus Medius  1  Deep hip external rotators    Sacrum   PSIS    Fortin's  Area (SIJ)    Coccyx   Ischial Tuberosity    Greater Trochanter  1  (Blank rows = not tested) Graded on 0-4 scale (0 = no pain, 1 = pain, 2 = pain with wincing/grimacing/flinching, 3 = pain with withdrawal, 4 = unwilling to allow palpation)  Passive Accessory Intervertebral Motion Not Tested  Special Tests Lumbar  Radiculopathy and Discogenic: Centralization and Peripheralization (SN 92, -LR 0.12): Not done Slump (SN 83, -LR 0.32): R: Negative L: Negative SLR (SN 92, -LR 0.29): R: Not done L:  Not done  Hip: FABER (SN 81): R: Negative L: Negative FADIR (SN 94): R: Negative L: Negative Hip scour (SN 50): R: Negative L: Negative   Physical Performance Measures Deferred this session    TODAY'S TREATMENT: 11/26/23  SUBJECTIVE: Pt reports that he is doing well today. No changes since last session. Denies pain. Reports he has been compliant with HEP and has had no issues with it. No specific questions or concerns.    PAIN: Denies   OBJECTIVE:   Ther-ex  Nustep lvl 1-4, 10 minutes for BLE strengthening and warm-up during interval history (5 mins unbilled);  Isometric supine SLR against SPT manual resistance 5 x 10; Isometric supine hip ABD against SPT manual resistance 5 x 10; Supine bridges 3 x 10; Supine ADD ball squeeze 2 x 10 x 5;  Isometric supine hip ext into mat table 5 x 10;  Supine Clam with black theraband 2 x 10 x 5;   Ther-Act Forwards monster walks with Nautilus 125 #, 4 laps x 30 ft;  Backwards monster walks with Nautilus 125 #, 4 laps x 30 ft; Lateral Banded walks with Nautilus 125 #, 4 laps x 30 ft ea direction; Step ups to 12 stair with SL hip hike hold 2 x 10 ea leg; Forward alt. lunges with #8 DB in ea hand 2 x 30 ft  Calf raises 2 x 20   PATIENT EDUCATION:  Education details: HEP and POC Person educated: Patient Education method: Explanation and demonstration Education comprehension: verbalized understanding and returned demonstration  HOME EXERCISE PROGRAM:  Access Code: [email or web address] URL: [email or web address] Date: 11/23/2023 Prepared by: Selinda Eck  Exercises - Hooklying Isometric Hip Abduction with Belt  - 2 x daily - 7 x weekly - 1 sets - 10 reps - 10s hold - Hooklying Isometric Hip Flexion  - 2 x daily - 7 x weekly - 1 sets - 10  reps - 10s hold - Supine 90/90 Isometric Hip Extension  - 2 x daily - 7 x weekly - 1 sets - 10 reps - 10s hold - Forward Monster Walks  - 1 x daily - 7 x weekly - Backward Monster Walks  - 1 x daily - 7 x weekly - Side Stepping with Resistance at Ankles  - 1 x daily - 7 x weekly - Mini Squat with Counter Support  - 2 x daily - 7 x weekly - 2 sets - 10 reps - Standing 3-Way Leg Reach with Resistance at Ankles and Counter Support  - 2 x daily - 7 x weekly - 2 sets - 10 reps   ASSESSMENT:  CLINICAL IMPRESSION: Initiated strength and power exercises during session today with patient. Isometric exercises were added to help strengthen the hip muscles with minimal load on the gluteal tendons. Weighted Nautilus exercises were done for dynamic functional strengthening during ambulation. Initiated step ups for power, gluteal strengthening, and pelvic stability. Issued HEP and reviewed with patient. Pt encouraged to  follow-up as scheduled. Pt will benefit from PT services to address deficits in strength, ROM, and general mobility in order to return to his PLOF and be able to perform care-taking tasks once his son is born.     OBJECTIVE IMPAIRMENTS: Abnormal gait, decreased activity tolerance, decreased mobility, difficulty walking, decreased ROM, decreased strength, obesity, and pain.   ACTIVITY LIMITATIONS: carrying, lifting, bending, standing, squatting, and locomotion level  PARTICIPATION LIMITATIONS: cleaning, driving, shopping, community activity, and occupation  PERSONAL FACTORS: 3+ comorbidities: HTN, DMT2, OSA, Dyslipidemia, and Obesity are also affecting patient's functional outcome.   REHAB POTENTIAL: Good  CLINICAL DECISION MAKING: Stable/uncomplicated  EVALUATION COMPLEXITY: Low   GOALS: Goals reviewed with patient? Yes  SHORT TERM GOALS: Target date: 11/30/2023  Pt will be independent with HEP in order to improve strength and decrease hip pain to improve pain-free function at home  and work. Baseline: 11/23/23: HEP given  Goal status: INITIAL   LONG TERM GOALS: Target date: 12/28/2023   1.  Pt will decrease worst hip pain by at least 3 points on the NPRS in order to demonstrate clinically significant reduction in hip pain. Baseline: 11/16/23: 7/10 Goal status: INITIAL  3.  Pt will decrease HAGOS score by at least 20 points in order demonstrate clinically significant reduction in hip pain/disability.       Baseline: 11/16/23: 60.6% Goal status: INITIAL  4. Pt will increase hip flexion, hip ABD, and hip ER strength by at least 1/2 MMT grade         in order to demonstrate improvement in strength and function.     Baseline: 11/16/23: 4/5     Goal status: INITIAL   PLAN: PT FREQUENCY: 1-2x/week  PT DURATION: 6 weeks  PLANNED INTERVENTIONS: Therapeutic exercises, Therapeutic activity, Neuromuscular re-education, Balance training, Gait training, Patient/Family education, Self Care, Joint mobilization, Joint manipulation, Vestibular training, Canalith repositioning, Orthotic/Fit training, DME instructions, Dry Needling, Electrical stimulation, Spinal manipulation, Spinal mobilization, Cryotherapy, Moist heat, Taping, Traction, Ultrasound, Ionotophoresis 4mg /ml Dexamethasone, Manual therapy, and Re-evaluation.  PLAN FOR NEXT SESSION: Stairs, continue isometrics, check HEP progress, work Financial controller education   Vernell Moats, SPT Jason D Huprich PT, DPT, G[facility]  Huprich,Jason, PT 11/27/2023, 10:43 AM

## 2023-11-30 ENCOUNTER — Ambulatory Visit

## 2023-12-02 ENCOUNTER — Encounter

## 2023-12-03 ENCOUNTER — Ambulatory Visit

## 2023-12-03 DIAGNOSIS — M25552 Pain in left hip: Secondary | ICD-10-CM | POA: Diagnosis not present

## 2023-12-03 DIAGNOSIS — M6281 Muscle weakness (generalized): Secondary | ICD-10-CM

## 2023-12-03 DIAGNOSIS — M25652 Stiffness of left hip, not elsewhere classified: Secondary | ICD-10-CM

## 2023-12-03 NOTE — Therapy (Signed)
 OUTPATIENT PHYSICAL THERAPY HIP TREATMENT   Patient Name: Travis Petty MRN: [id number] DOB:[birth year], 48 y.o., male Today's Date: 12/03/2023  END OF SESSION:  PT End of Session - 12/03/23 1447     Visit Number 4    Number of Visits 13    Date for PT Re-Evaluation 12/28/23    Authorization Type eval: 11/16/23    PT Start Time 1448    PT Stop Time 1530    PT Time Calculation (min) 42 min    Activity Tolerance Patient tolerated treatment well    Behavior During Therapy [facility] for tasks assessed/performed         Past Medical History:  Diagnosis Date   Adenomatous polyps 06/07/2018   Formatting of this note might be different from the original.  Added automatically from request for surgery [id number]     Allergy    Bronchitis 05/25/2018   Last Assessment & Plan:  Formatting of this note might be different from the original. Reviewed hx, sx, exam with pt Proceed with antibx With recent doxycycline and allergies, will proceed with rx levofloxacin. Also included short burst of po steroid. If symptoms not improved in next 3-5 days with treatment/plan recommended, patient was advised to follow up with office for further recommendations.   Cholecystitis    Choledocholithiasis 04/30/2019   Deviated septum    GERD (gastroesophageal reflux disease)    Gout    H/O colonoscopy with polypectomy 05/07/2018   Formatting of this note might be different from the original.  Last Assessment & Plan:   Formatting of this note might be different from the original.  Refer for consideration for repeat colonoscopy  Last Assessment & Plan:   Formatting of this note might be different from the original.  Refer for consideration for repeat colonoscopy     Hypertension    Left Achilles tendinitis 11/04/2018   Last Assessment & Plan:  Formatting of this note might be different from the original. 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 This appears benign just monitor.   Past Surgical History:  Procedure Laterality Date   CHOLECYSTECTOMY  2020   WISDOM TOOTH EXTRACTION     Patient Active Problem List   Diagnosis Date Noted   Greater trochanteric pain syndrome of left lower extremity 09/16/2023   Great toe pain, left 09/16/2023   Long-term (current) use of injectable non-insulin antidiabetic drugs 09/16/2023   Peroneal tendonitis of left lower leg 12/16/2022   Class 2 obesity with alveolar hypoventilation, serious comorbidity, and body mass index (BMI) of 39.0 to 39.9 in adult (HCC) 12/10/2022   Toe pain, right 12/10/2022   Mass of lower outer quadrant of left breast 09/10/2022   Subareolar mass of right breast 09/10/2022   Healthcare maintenance 09/25/2021   OSA on CPAP 05/30/2021   Allergic rhinitis 05/30/2021   Chronic left-sided lumbar radiculopathy 05/30/2021   Other constipation 05/05/2019   Type 2 diabetes mellitus with complication (HCC) 11/04/2018   Colon polyp 05/07/2018   Dyslipidemia 05/07/2018   Primary hypertension 05/07/2018   Chronic rhinitis 04/13/2011   PCP: Alvia Selinda PARAS, MD  REFERRING PROVIDER: Alvia Selinda PARAS, MD  REFERRING DIAG: [id number] (ICD-10-CM) - Greater trochanteric pain syndrome of left lower extremity   RATIONALE FOR EVALUATION  AND TREATMENT: Rehabilitation  THERAPY DIAG: Pain in left hip  Stiffness of left hip, not elsewhere classified  Muscle weakness (generalized)  ONSET DATE: March-April 2025  FOLLOW-UP APPT SCHEDULED WITH REFERRING PROVIDER: No   FROM EVAL 11/16/23:  SUBJECTIVE:                                                                                                                                                                                         SUBJECTIVE STATEMENT:  I just wanna be able to  play with my baby and move around without my hip hurting me  PERTINENT HISTORY:  Pt is an expectant father with his first child to be born in late November of 2025. Pt's goal is to be able to help care for his son once he is born without having pain in his hip. Pt is moving to [place] to be with his fiance and unborn son in August.   Describes his L hip pain as an aching tension/tightness that comes and goes. Pt notices the pain the most when standing on hard or uneven surfaces. Pt reports taking 1 of the prescribed Celebrex  every morning to help with pain relief. Pt says his pain is only located in the lateral hip and does not radiate or refer. He reports no change in sensation and no numbness or tingling.    From Visit with Dr. Alvia on 09/16/23:  He experiences a 'twinge' and tightness in his left hip, particularly when standing up. The pain is located on the outer hip. He suspects his sleeping position may exacerbate the pain. He has not been engaging in specific exercises that might alleviate the issue, such as those from his football days.   Left greater trochanteric pain syndrome-trochanteric bursitis  Left hip pain due to greater trochanteric pain syndrome with muscular imbalance. Symptoms worsen with contact and movement. - Prescribe Celebrex  2 mg anti-inflammatory medication for hip pain.  Take 1-2 times daily on an as-needed basis for pain, take with food. - Refer to physical therapy for targeted exercises. - Consider ultrasound-guided injection if symptoms persist. - Encourage home exercises to strengthen hip stabilizers  PAIN:    Pain Intensity: Present: 4/10, Best: 1/10, Worst: 7/10 Pain location: lateral L hip  Pain Quality: intermittent, aching, and tightness  Radiating: No  Numbness/Tingling: No Focal Weakness: No Aggravating factors: when his achilles tendinitis flares his hip pain flares, not wearing comfy shoes, standing on hard surfaces for long periods of time, standing  on uneven surfaces; Relieving factors: Celebrex , rest 24-hour pain behavior: depends on the activity of the day, usually tighter in the morning How long can you sit:  5 hours How long can you stand: 20-30 mins History of prior back or hip injury, pain, surgery, or therapy: Yes; PT for L lumbar radiculopathy Spring 2023 Dominant hand: right Imaging: No  Red flags: Negative for bowel/bladder changes, saddle paresthesia, testicular pain, h/o kidney stones, h/o spinal tumors, h/o compression fx, personal history of cancer/malignancy (especially prostate or pelvic), acute hip trauma, night pain, h/o abdominal aneurysm, abdominal pain, chills/fever, night sweats, nausea, vomiting, diarrhea, unexplained weight gain/loss;  PRECAUTIONS: None  WEIGHT BEARING RESTRICTIONS: No  FALLS: Has patient fallen in last 6 months? No  Living Environment Lives with: lives alone now, but moving to 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  Recent memory is intact.  Remote memory is intact.  Attention span and concentration are intact.  Expressive speech is intact.  Patient's fund of knowledge is within normal limits for educational  level.    Gross Musculoskeletal Assessment Tremor: None; Bulk: Normal, no muscle wasting noted; Tone: Normal;  GAIT: Distance walked: 100 ft Assistive device utilized: None Level of assistance: Complete Independence Comments: wide BOS with bilat hip ER (toe-out)  Posture: Mild kyphotic sitting posture Bilat hip ER in standing (toe-out) Widened BOS  AROM AROM (Normal range in degrees) AROM   Hip Right Left  Flexion (125) 93 93  Extension (15)    Abduction (40) 32 40  Adduction (30) 26 30  Internal Rotation (45) 19 18  External Rotation (45) 41 37      Knee    Flexion (135) WNL WNL  Extension (0) WNL WNL  (* = pain; Blank rows = not tested)  LE MMT: MMT (out of 5) Right  Left   Hip flexion 4/5 4/5*  Hip extension 4+/5 4+/5  Hip abduction 4/5 4/5  Hip adduction 4/5 4/5  Hip internal rotation    Hip external rotation (seated) 4/5 4/5  Knee flexion 4+/5 4+/5  Knee extension 5/5 5/5  Ankle dorsiflexion 5/5 5/5  Ankle plantarflexion Active Active  Ankle inversion    Ankle eversion    (* = pain; Blank rows = not tested)  Sensation Grossly intact to light touch throughout bilateral LEs as determined by testing dermatomes L2-S2. Proprioception, stereognosis, and hot/cold testing deferred on this date.  Reflexes Not tested  Muscle Length Not tested  Palpation Location Right Left         Lumbar paraspinals 0 0  Quadratus Lumborum 0 0  Iliac Crest 0 0  ASIS    Pubic Rami    Pubic symphysis   Femoral Triangle    Inguinal ligament    Gluteus Maximus    Gluteus Medius  1  Deep hip external rotators    Sacrum   PSIS    Fortin's  Area (SIJ)    Coccyx   Ischial Tuberosity    Greater Trochanter  1  (Blank rows = not tested) Graded on 0-4 scale (0 = no pain, 1 = pain, 2 = pain with wincing/grimacing/flinching, 3 = pain with withdrawal, 4 = unwilling to allow palpation)  Passive Accessory Intervertebral Motion Not Tested  Special Tests Lumbar  Radiculopathy and Discogenic: Centralization and Peripheralization (SN 92, -LR 0.12): Not done Slump (SN 83, -LR 0.32): R: Negative L: Negative SLR (SN 92, -LR 0.29): R: Not done L:  Not done  Hip: FABER (SN 81): R: Negative L: Negative FADIR (SN 94): R: Negative L: Negative Hip scour (SN 50): R: Negative L: Negative   Physical Performance Measures Deferred this session    TODAY'S TREATMENT: 12/03/23  SUBJECTIVE: Pt reports that he is doing well today. He notes soreness following the last session. He also had some soreness following his long drive down to [place]  due to riding for long stretches in the car. Reports he has been compliant with HE. No specific questions or concerns.    PAIN: 3/10 L hip soreness;   OBJECTIVE:   Ther-ex  Nustep L1-4 x 10 minutes for BLE strengthening and warm-up during interval history (3 mins unbilled); R sidelying L hip isometric clam 10s hold x 5; R sidelying L hip isometric abduction 10s hold x 5; R sidelying manually resisted L hip reverse clams x 10;  Hooklying bridges with blue tband around knees x 10; Forward/backward monster walks with blue tband around ankles 30' x 4; Lateral resisted walks with blue tband around ankles 30' x 4;   Manual Therapy  L hip FABER and FADIR stretches 2 x 45s each; R sidelying L lateral hip and lateral thigh STM with theraband roller;   PATIENT EDUCATION:  Education details: Pt educated throughout session about proper posture and technique with exercises. Improved exercise technique, movement at target joints, use of target muscles after min to mod verbal, visual, tactile cues.  Person educated: Patient Education method: Explanation and demonstration Education comprehension: verbalized understanding and returned demonstration  HOME EXERCISE PROGRAM:  Access Code: [email or web address] URL: [email or web address] Date: 11/23/2023 Prepared by: Selinda Eck  Exercises - Hooklying Isometric Hip  Abduction with Belt  - 2 x daily - 7 x weekly - 1 sets - 10 reps - 10s hold - Hooklying Isometric Hip Flexion  - 2 x daily - 7 x weekly - 1 sets - 10 reps - 10s hold - Supine 90/90 Isometric Hip Extension  - 2 x daily - 7 x weekly - 1 sets - 10 reps - 10s hold - Forward Monster Walks  - 1 x daily - 7 x weekly - Backward Monster Walks  - 1 x daily - 7 x weekly - Side Stepping with Resistance at Ankles  - 1 x daily - 7 x weekly - Mini Squat with Counter Support  - 2 x daily - 7 x weekly - 2 sets - 10 reps - Standing 3-Way Leg Reach with Resistance at Ankles and Counter Support  - 2 x daily - 7 x weekly - 2 sets - 10 reps   ASSESSMENT:  CLINICAL IMPRESSION: Progressed manual therapy and strengthening during session today. Isometric exercises were pain-free again today. No HEP modifications at this time. Pt encouraged to follow-up as scheduled. He will benefit from PT services to address deficits in strength, ROM, and general mobility in order to return to his PLOF and be able to perform care-taking tasks once his  son is born.     OBJECTIVE IMPAIRMENTS: Abnormal gait, decreased activity tolerance, decreased mobility, difficulty walking, decreased ROM, decreased strength, obesity, and pain.   ACTIVITY LIMITATIONS: carrying, lifting, bending, standing, squatting, and locomotion level  PARTICIPATION LIMITATIONS: cleaning, driving, shopping, community activity, and occupation  PERSONAL FACTORS: 3+ comorbidities: HTN, DMT2, OSA, Dyslipidemia, and Obesity are also affecting patient's functional outcome.   REHAB POTENTIAL: Good  CLINICAL DECISION MAKING: Stable/uncomplicated  EVALUATION COMPLEXITY: Low   GOALS: Goals reviewed with patient? Yes  SHORT TERM GOALS: Target date: 11/30/2023  Pt will be independent with HEP in order to improve strength and decrease hip pain to improve pain-free function at home and work. Baseline: 11/23/23: HEP given  Goal status: INITIAL   LONG TERM GOALS:  Target date: 12/28/2023   1.  Pt will decrease worst hip pain by at least 3 points on the NPRS in order to demonstrate clinically significant reduction in hip pain. Baseline: 11/16/23: 7/10 Goal status: INITIAL  3.  Pt will decrease HAGOS score by at least 20 points in order demonstrate clinically significant reduction in hip pain/disability.       Baseline: 11/16/23: 60.6% Goal status: INITIAL  4. Pt will increase hip flexion, hip ABD, and hip ER strength by at least 1/2 MMT grade         in order to demonstrate improvement in strength and function.     Baseline: 11/16/23: 4/5     Goal status: INITIAL   PLAN: PT FREQUENCY: 1-2x/week  PT DURATION: 6 weeks  PLANNED INTERVENTIONS: Therapeutic exercises, Therapeutic activity, Neuromuscular re-education, Balance training, Gait training, Patient/Family education, Self Care, Joint mobilization, Joint manipulation, Vestibular training, Canalith repositioning, Orthotic/Fit training, DME instructions, Dry Needling, Electrical stimulation, Spinal manipulation, Spinal mobilization, Cryotherapy, Moist heat, Taping, Traction, Ultrasound, Ionotophoresis 4mg /ml Dexamethasone, Manual therapy, and Re-evaluation.  PLAN FOR NEXT SESSION: Stairs, continue isometrics, check HEP progress, work Office manager D Simaya Lumadue PT, DPT, [employer]  Joice Nazario, PT 12/03/2023, 4:04 PM

## 2023-12-04 ENCOUNTER — Encounter: Payer: Self-pay | Admitting: Family Medicine

## 2023-12-05 NOTE — Therapy (Unsigned)
 OUTPATIENT PHYSICAL THERAPY HIP TREATMENT   Patient Name: Travis Petty MRN: [id number] DOB:[birth year], 48 y.o., male Today's Date: 12/08/2023  END OF SESSION:  PT End of Session - 12/07/23 1407     Visit Number 5    Number of Visits 13    Date for PT Re-Evaluation 12/28/23    Authorization Type eval: 11/16/23    PT Start Time 1405    PT Stop Time 1445    PT Time Calculation (min) 40 min    Activity Tolerance Patient tolerated treatment well    Behavior During Therapy [facility] for tasks assessed/performed          Past Medical History:  Diagnosis Date   Adenomatous polyps 06/07/2018   Formatting of this note might be different from the original.  Added automatically from request for surgery [id number]     Allergy    Bronchitis 05/25/2018   Last Assessment & Plan:  Formatting of this note might be different from the original. Reviewed hx, sx, exam with pt Proceed with antibx With recent doxycycline and allergies, will proceed with rx levofloxacin. Also included short burst of po steroid. If symptoms not improved in next 3-5 days with treatment/plan recommended, patient was advised to follow up with office for further recommendations.   Cholecystitis    Choledocholithiasis 04/30/2019   Deviated septum    GERD (gastroesophageal reflux disease)    Gout    H/O colonoscopy with polypectomy 05/07/2018   Formatting of this note might be different from the original.  Last Assessment & Plan:   Formatting of this note might be different from the original.  Refer for consideration for repeat colonoscopy  Last Assessment & Plan:   Formatting of this note might be different from the original.  Refer for consideration for repeat colonoscopy     Hypertension    Left Achilles tendinitis 11/04/2018   Last Assessment & Plan:  Formatting of this note might be different from the original. 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 This appears benign just monitor.   Past Surgical History:  Procedure Laterality Date   CHOLECYSTECTOMY  2020   WISDOM TOOTH EXTRACTION     Patient Active Problem List   Diagnosis Date Noted   Greater trochanteric pain syndrome of left lower extremity 09/16/2023   Great toe pain, left 09/16/2023   Long-term (current) use of injectable non-insulin antidiabetic drugs 09/16/2023   Peroneal tendonitis of left lower leg 12/16/2022   Class 2 obesity with alveolar hypoventilation, serious comorbidity, and body mass index (BMI) of 39.0 to 39.9 in adult (HCC) 12/10/2022   Toe pain, right 12/10/2022   Mass of lower outer quadrant of left breast 09/10/2022   Subareolar mass of right breast 09/10/2022   Healthcare maintenance 09/25/2021   OSA on CPAP 05/30/2021   Allergic rhinitis 05/30/2021   Chronic left-sided lumbar radiculopathy 05/30/2021   Other constipation 05/05/2019   Type 2 diabetes mellitus with complication (HCC) 11/04/2018   Colon polyp 05/07/2018   Dyslipidemia 05/07/2018   Primary hypertension 05/07/2018   Chronic rhinitis 04/13/2011   PCP: Alvia Selinda PARAS, MD  REFERRING PROVIDER: Alvia Selinda PARAS, MD  REFERRING DIAG: [phone] (ICD-10-CM) - Greater trochanteric pain syndrome of left lower extremity   RATIONALE FOR  EVALUATION AND TREATMENT: Rehabilitation  THERAPY DIAG: Pain in left hip  Stiffness of left hip, not elsewhere classified  Muscle weakness (generalized)  ONSET DATE: March-April 2025  FOLLOW-UP APPT SCHEDULED WITH REFERRING PROVIDER: No   FROM EVAL 11/16/23:  SUBJECTIVE:                                                                                                                                                                                         SUBJECTIVE STATEMENT:  I just wanna be able to  play with my baby and move around without my hip hurting me  PERTINENT HISTORY:  Pt is an expectant father with his first child to be born in late November of 2025. Pt's goal is to be able to help care for his son once he is born without having pain in his hip. Pt is moving to [place] to be with his fiance and unborn son in August.   Describes his L hip pain as an aching tension/tightness that comes and goes. Pt notices the pain the most when standing on hard or uneven surfaces. Pt reports taking 1 of the prescribed Celebrex  every morning to help with pain relief. Pt says his pain is only located in the lateral hip and does not radiate or refer. He reports no change in sensation and no numbness or tingling.    From Visit with Dr. Alvia on 09/16/23:  He experiences a 'twinge' and tightness in his left hip, particularly when standing up. The pain is located on the outer hip. He suspects his sleeping position may exacerbate the pain. He has not been engaging in specific exercises that might alleviate the issue, such as those from his football days.   Left greater trochanteric pain syndrome-trochanteric bursitis  Left hip pain due to greater trochanteric pain syndrome with muscular imbalance. Symptoms worsen with contact and movement. - Prescribe Celebrex  2 mg anti-inflammatory medication for hip pain.  Take 1-2 times daily on an as-needed basis for pain, take with food. - Refer to physical therapy for targeted exercises. - Consider ultrasound-guided injection if symptoms persist. - Encourage home exercises to strengthen hip stabilizers  PAIN:    Pain Intensity: Present: 4/10, Best: 1/10, Worst: 7/10 Pain location: lateral L hip  Pain Quality: intermittent, aching, and tightness  Radiating: No  Numbness/Tingling: No Focal Weakness: No Aggravating factors: when his achilles tendinitis flares his hip pain flares, not wearing comfy shoes, standing on hard surfaces for long periods of time, standing  on uneven surfaces; Relieving factors: Celebrex , rest 24-hour pain behavior: depends on the activity of the day, usually tighter in the morning How long can you  sit: 5 hours How long can you stand: 20-30 mins History of prior back or hip injury, pain, surgery, or therapy: Yes; PT for L lumbar radiculopathy Spring 2023 Dominant hand: right Imaging: No  Red flags: Negative for bowel/bladder changes, saddle paresthesia, testicular pain, h/o kidney stones, h/o spinal tumors, h/o compression fx, personal history of cancer/malignancy (especially prostate or pelvic), acute hip trauma, night pain, h/o abdominal aneurysm, abdominal pain, chills/fever, night sweats, nausea, vomiting, diarrhea, unexplained weight gain/loss;  PRECAUTIONS: None  WEIGHT BEARING RESTRICTIONS: No  FALLS: Has patient fallen in last 6 months? 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  Recent memory is intact.  Remote memory is intact.  Attention span and concentration are intact.  Expressive speech is intact.  Patient's fund of knowledge is within normal limits for educational  level.    Gross Musculoskeletal Assessment Tremor: None; Bulk: Normal, no muscle wasting noted; Tone: Normal;  GAIT: Distance walked: 100 ft Assistive device utilized: None Level of assistance: Complete Independence Comments: wide BOS with bilat hip ER (toe-out)  Posture: Mild kyphotic sitting posture Bilat hip ER in standing (toe-out) Widened BOS  AROM AROM (Normal range in degrees) AROM   Hip Right Left  Flexion (125) 93 93  Extension (15)    Abduction (40) 32 40  Adduction (30) 26 30  Internal Rotation (45) 19 18  External Rotation (45) 41 37      Knee    Flexion (135) WNL WNL  Extension (0) WNL WNL  (* = pain; Blank rows = not tested)  LE MMT: MMT (out of 5) Right  Left   Hip flexion 4/5 4/5*  Hip extension 4+/5 4+/5  Hip abduction 4/5 4/5  Hip adduction 4/5 4/5  Hip internal rotation    Hip external rotation (seated) 4/5 4/5  Knee flexion 4+/5 4+/5  Knee extension 5/5 5/5  Ankle dorsiflexion 5/5 5/5  Ankle plantarflexion Active Active  Ankle inversion    Ankle eversion    (* = pain; Blank rows = not tested)  Sensation Grossly intact to light touch throughout bilateral LEs as determined by testing dermatomes L2-S2. Proprioception, stereognosis, and hot/cold testing deferred on this date.  Reflexes Not tested  Muscle Length Not tested  Palpation Location Right Left         Lumbar paraspinals 0 0  Quadratus Lumborum 0 0  Iliac Crest 0 0  ASIS    Pubic Rami    Pubic symphysis   Femoral Triangle    Inguinal ligament    Gluteus Maximus    Gluteus Medius  1  Deep hip external rotators    Sacrum   PSIS  Fortin's Area (SIJ)    Coccyx   Ischial Tuberosity    Greater Trochanter  1  (Blank rows = not tested) Graded on 0-4 scale (0 = no pain, 1 = pain, 2 = pain with wincing/grimacing/flinching, 3 = pain with withdrawal, 4 = unwilling to allow palpation)  Passive Accessory Intervertebral Motion Not Tested  Special Tests Lumbar  Radiculopathy and Discogenic: Centralization and Peripheralization (SN 92, -LR 0.12): Not done Slump (SN 83, -LR 0.32): R: Negative L: Negative SLR (SN 92, -LR 0.29): R: Not done L:  Not done  Hip: FABER (SN 81): R: Negative L: Negative FADIR (SN 94): R: Negative L: Negative Hip scour (SN 50): R: Negative L: Negative   Physical Performance Measures Deferred this session    TODAY'S TREATMENT: 12/07/23  SUBJECTIVE: Pt reports that he is having some irritation of his L achilles tendon today. He notes improvement in L hip pain with stretches after the last session. Reports he has been compliant with HEP. No specific questions.   PAIN: 3/10 L hip soreness;   OBJECTIVE:   Ther-ex  R sidelying L manually resisted abduction 2 x 10; R sidelying manually resisted L hip reverse clams 2 x 10;  Hooklying bridges with blue tband around knees x 10; Hooklying L single leg bridges with blue tband around knees 2 x 10; L sidelying L hip bridges 2 x 10;   Manual Therapy  L hip FABER, FADIR, and single knee to chest stretches 2 x 45s each; R sidelying L lateral hip and lateral thigh STM with theraband roller;   Not performed: Forward/backward monster walks with blue tband around ankles 30' x 4; Lateral resisted walks with blue tband around ankles 30' x 4;  PATIENT EDUCATION:  Education details: Pt educated throughout session about proper posture and technique with exercises. Improved exercise technique, movement at target joints, use of target muscles after min to mod verbal, visual, tactile cues.  Person educated: Patient Education method: Explanation and demonstration Education comprehension: verbalized understanding and returned demonstration  HOME EXERCISE PROGRAM:  Access Code: [email or web address] URL: [email or web address] Date: 11/23/2023 Prepared by: Selinda Eck  Exercises - Hooklying Isometric Hip Abduction with Belt  - 2 x daily - 7 x weekly - 1 sets - 10 reps - 10s  hold - Hooklying Isometric Hip Flexion  - 2 x daily - 7 x weekly - 1 sets - 10 reps - 10s hold - Supine 90/90 Isometric Hip Extension  - 2 x daily - 7 x weekly - 1 sets - 10 reps - 10s hold - Forward Monster Walks  - 1 x daily - 7 x weekly - Backward Monster Walks  - 1 x daily - 7 x weekly - Side Stepping with Resistance at Ankles  - 1 x daily - 7 x weekly - Mini Squat with Counter Support  - 2 x daily - 7 x weekly - 2 sets - 10 reps - Standing 3-Way Leg Reach with Resistance at Ankles and Counter Support  - 2 x daily - 7 x weekly - 2 sets - 10 reps   ASSESSMENT:  CLINICAL IMPRESSION: Deferred NuStep and focused on mat table exercises today to prevent irritation of L achilles tendon. Progressed manual therapy and repeated L hip stretches as they proved to be relieving after last session. Pt is able to localize strengthening to L hip and reports only minimal increase in pain during session. Pt encouraged to follow-up as scheduled. He will benefit from PT services to address deficits  in strength, ROM, and general mobility in order to return to his PLOF and be able to perform care-taking tasks once his son is born.     OBJECTIVE IMPAIRMENTS: Abnormal gait, decreased activity tolerance, decreased mobility, difficulty walking, decreased ROM, decreased strength, obesity, and pain.   ACTIVITY LIMITATIONS: carrying, lifting, bending, standing, squatting, and locomotion level  PARTICIPATION LIMITATIONS: cleaning, driving, shopping, community activity, and occupation  PERSONAL FACTORS: 3+ comorbidities: HTN, DMT2, OSA, Dyslipidemia, and Obesity are also affecting patient's functional outcome.   REHAB POTENTIAL: Good  CLINICAL DECISION MAKING: Stable/uncomplicated  EVALUATION COMPLEXITY: Low   GOALS: Goals reviewed with patient? Yes  SHORT TERM GOALS: Target date: 11/30/2023  Pt will be independent with HEP in order to improve strength and decrease hip pain to improve pain-free function at  home and work. Baseline: 11/23/23: HEP given  Goal status: INITIAL   LONG TERM GOALS: Target date: 12/28/2023   1.  Pt will decrease worst hip pain by at least 3 points on the NPRS in order to demonstrate clinically significant reduction in hip pain. Baseline: 11/16/23: 7/10 Goal status: INITIAL  3.  Pt will decrease HAGOS score by at least 20 points in order demonstrate clinically significant reduction in hip pain/disability.       Baseline: 11/16/23: 60.6% Goal status: INITIAL  4. Pt will increase hip flexion, hip ABD, and hip ER strength by at least 1/2 MMT grade         in order to demonstrate improvement in strength and function.     Baseline: 11/16/23: 4/5     Goal status: INITIAL   PLAN: PT FREQUENCY: 1-2x/week  PT DURATION: 6 weeks  PLANNED INTERVENTIONS: Therapeutic exercises, Therapeutic activity, Neuromuscular re-education, Balance training, Gait training, Patient/Family education, Self Care, Joint mobilization, Joint manipulation, Vestibular training, Canalith repositioning, Orthotic/Fit training, DME instructions, Dry Needling, Electrical stimulation, Spinal manipulation, Spinal mobilization, Cryotherapy, Moist heat, Taping, Traction, Ultrasound, Ionotophoresis 4mg /ml Dexamethasone, Manual therapy, and Re-evaluation.  PLAN FOR NEXT SESSION: Stairs, continue isometrics, check HEP progress, work Office manager D Xochilth Standish PT, DPT, [employer]  Sherron Mummert, PT 12/08/2023, 5:17 PM

## 2023-12-07 ENCOUNTER — Ambulatory Visit: Attending: Family Medicine

## 2023-12-07 DIAGNOSIS — M25552 Pain in left hip: Secondary | ICD-10-CM | POA: Insufficient documentation

## 2023-12-07 DIAGNOSIS — M25652 Stiffness of left hip, not elsewhere classified: Secondary | ICD-10-CM | POA: Diagnosis present

## 2023-12-07 DIAGNOSIS — M6281 Muscle weakness (generalized): Secondary | ICD-10-CM | POA: Diagnosis present

## 2023-12-09 ENCOUNTER — Encounter

## 2023-12-10 ENCOUNTER — Ambulatory Visit

## 2023-12-10 DIAGNOSIS — M25552 Pain in left hip: Secondary | ICD-10-CM | POA: Diagnosis not present

## 2023-12-10 DIAGNOSIS — M6281 Muscle weakness (generalized): Secondary | ICD-10-CM

## 2023-12-10 DIAGNOSIS — M25652 Stiffness of left hip, not elsewhere classified: Secondary | ICD-10-CM

## 2023-12-10 NOTE — Therapy (Signed)
 OUTPATIENT PHYSICAL THERAPY HIP TREATMENT   Patient Name: Travis Petty MRN: [id number] DOB:[birth year], 48 y.o., male Today's Date: 12/10/2023  END OF SESSION:  PT End of Session - 12/10/23 1511     Visit Number 6    Number of Visits 13    Date for PT Re-Evaluation 12/28/23    Authorization Type eval: 11/16/23    PT Start Time 1452    PT Stop Time 1530    PT Time Calculation (min) 38 min    Activity Tolerance Patient tolerated treatment well    Behavior During Therapy [facility] for tasks assessed/performed         Past Medical History:  Diagnosis Date   Adenomatous polyps 06/07/2018   Formatting of this note might be different from the original.  Added automatically from request for surgery [id number]     Allergy    Bronchitis 05/25/2018   Last Assessment & Plan:  Formatting of this note might be different from the original. Reviewed hx, sx, exam with pt Proceed with antibx With recent doxycycline and allergies, will proceed with rx levofloxacin. Also included short burst of po steroid. If symptoms not improved in next 3-5 days with treatment/plan recommended, patient was advised to follow up with office for further recommendations.   Cholecystitis    Choledocholithiasis 04/30/2019   Deviated septum    GERD (gastroesophageal reflux disease)    Gout    H/O colonoscopy with polypectomy 05/07/2018   Formatting of this note might be different from the original.  Last Assessment & Plan:   Formatting of this note might be different from the original.  Refer for consideration for repeat colonoscopy  Last Assessment & Plan:   Formatting of this note might be different from the original.  Refer for consideration for repeat colonoscopy     Hypertension    Left Achilles tendinitis 11/04/2018   Last Assessment & Plan:  Formatting of this note might be different from the original. 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 This appears benign just monitor.   Past Surgical History:  Procedure Laterality Date   CHOLECYSTECTOMY  2020   WISDOM TOOTH EXTRACTION     Patient Active Problem List   Diagnosis Date Noted   Greater trochanteric pain syndrome of left lower extremity 09/16/2023   Great toe pain, left 09/16/2023   Long-term (current) use of injectable non-insulin antidiabetic drugs 09/16/2023   Peroneal tendonitis of left lower leg 12/16/2022   Class 2 obesity with alveolar hypoventilation, serious comorbidity, and body mass index (BMI) of 39.0 to 39.9 in adult (HCC) 12/10/2022   Toe pain, right 12/10/2022   Mass of lower outer quadrant of left breast 09/10/2022   Subareolar mass of right breast 09/10/2022   Healthcare maintenance 09/25/2021   OSA on CPAP 05/30/2021   Allergic rhinitis 05/30/2021   Chronic left-sided lumbar radiculopathy 05/30/2021   Other constipation 05/05/2019   Type 2 diabetes mellitus with complication (HCC) 11/04/2018   Colon polyp 05/07/2018   Dyslipidemia 05/07/2018   Primary hypertension 05/07/2018   Chronic rhinitis 04/13/2011   PCP: Alvia Selinda PARAS, MD  REFERRING PROVIDER: Alvia Selinda PARAS, MD  REFERRING DIAG: [phone] (ICD-10-CM) - Greater trochanteric pain syndrome of left lower extremity   RATIONALE FOR EVALUATION  AND TREATMENT: Rehabilitation  THERAPY DIAG: Pain in left hip  Stiffness of left hip, not elsewhere classified  Muscle weakness (generalized)  ONSET DATE: March-April 2025  FOLLOW-UP APPT SCHEDULED WITH REFERRING PROVIDER: No   FROM EVAL 11/16/23:  SUBJECTIVE:                                                                                                                                                                                         SUBJECTIVE STATEMENT:  I just wanna be able to  play with my baby and move around without my hip hurting me  PERTINENT HISTORY:  Pt is an expectant father with his first child to be born in late November of 2025. Pt's goal is to be able to help care for his son once he is born without having pain in his hip. Pt is moving to [place] to be with his fiance and unborn son in August.   Describes his L hip pain as an aching tension/tightness that comes and goes. Pt notices the pain the most when standing on hard or uneven surfaces. Pt reports taking 1 of the prescribed Celebrex  every morning to help with pain relief. Pt says his pain is only located in the lateral hip and does not radiate or refer. He reports no change in sensation and no numbness or tingling.    From Visit with Dr. Alvia on 09/16/23:  He experiences a 'twinge' and tightness in his left hip, particularly when standing up. The pain is located on the outer hip. He suspects his sleeping position may exacerbate the pain. He has not been engaging in specific exercises that might alleviate the issue, such as those from his football days.   Left greater trochanteric pain syndrome-trochanteric bursitis  Left hip pain due to greater trochanteric pain syndrome with muscular imbalance. Symptoms worsen with contact and movement. - Prescribe Celebrex  2 mg anti-inflammatory medication for hip pain.  Take 1-2 times daily on an as-needed basis for pain, take with food. - Refer to physical therapy for targeted exercises. - Consider ultrasound-guided injection if symptoms persist. - Encourage home exercises to strengthen hip stabilizers  PAIN:    Pain Intensity: Present: 4/10, Best: 1/10, Worst: 7/10 Pain location: lateral L hip  Pain Quality: intermittent, aching, and tightness  Radiating: No  Numbness/Tingling: No Focal Weakness: No Aggravating factors: when his achilles tendinitis flares his hip pain flares, not wearing comfy shoes, standing on hard surfaces for long periods of time, standing  on uneven surfaces; Relieving factors: Celebrex , rest 24-hour pain behavior: depends on the activity of the day, usually tighter in the morning How long can you sit:  5 hours How long can you stand: 20-30 mins History of prior back or hip injury, pain, surgery, or therapy: Yes; PT for L lumbar radiculopathy Spring 2023 Dominant hand: right Imaging: No  Red flags: Negative for bowel/bladder changes, saddle paresthesia, testicular pain, h/o kidney stones, h/o spinal tumors, h/o compression fx, personal history of cancer/malignancy (especially prostate or pelvic), acute hip trauma, night pain, h/o abdominal aneurysm, abdominal pain, chills/fever, night sweats, nausea, vomiting, diarrhea, unexplained weight gain/loss;  PRECAUTIONS: None  WEIGHT BEARING RESTRICTIONS: No  FALLS: Has patient fallen in last 6 months? 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  Recent memory is intact.  Remote memory is intact.  Attention span and concentration are intact.  Expressive speech is intact.  Patient's fund of knowledge is within normal limits for educational  level.    Gross Musculoskeletal Assessment Tremor: None; Bulk: Normal, no muscle wasting noted; Tone: Normal;  GAIT: Distance walked: 100 ft Assistive device utilized: None Level of assistance: Complete Independence Comments: wide BOS with bilat hip ER (toe-out)  Posture: Mild kyphotic sitting posture Bilat hip ER in standing (toe-out) Widened BOS  AROM AROM (Normal range in degrees) AROM   Hip Right Left  Flexion (125) 93 93  Extension (15)    Abduction (40) 32 40  Adduction (30) 26 30  Internal Rotation (45) 19 18  External Rotation (45) 41 37      Knee    Flexion (135) WNL WNL  Extension (0) WNL WNL  (* = pain; Blank rows = not tested)  LE MMT: MMT (out of 5) Right  Left   Hip flexion 4/5 4/5*  Hip extension 4+/5 4+/5  Hip abduction 4/5 4/5  Hip adduction 4/5 4/5  Hip internal rotation    Hip external rotation (seated) 4/5 4/5  Knee flexion 4+/5 4+/5  Knee extension 5/5 5/5  Ankle dorsiflexion 5/5 5/5  Ankle plantarflexion Active Active  Ankle inversion    Ankle eversion    (* = pain; Blank rows = not tested)  Sensation Grossly intact to light touch throughout bilateral LEs as determined by testing dermatomes L2-S2. Proprioception, stereognosis, and hot/cold testing deferred on this date.  Reflexes Not tested  Muscle Length Not tested  Palpation Location Right Left         Lumbar paraspinals 0 0  Quadratus Lumborum 0 0  Iliac Crest 0 0  ASIS    Pubic Rami    Pubic symphysis   Femoral Triangle    Inguinal ligament    Gluteus Maximus    Gluteus Medius  1  Deep hip external rotators    Sacrum   PSIS    Fortin's  Area (SIJ)    Coccyx   Ischial Tuberosity    Greater Trochanter  1  (Blank rows = not tested) Graded on 0-4 scale (0 = no pain, 1 = pain, 2 = pain with wincing/grimacing/flinching, 3 = pain with withdrawal, 4 = unwilling to allow palpation)  Passive Accessory Intervertebral Motion Not Tested  Special Tests Lumbar  Radiculopathy and Discogenic: Centralization and Peripheralization (SN 92, -LR 0.12): Not done Slump (SN 83, -LR 0.32): R: Negative L: Negative SLR (SN 92, -LR 0.29): R: Not done L:  Not done  Hip: FABER (SN 81): R: Negative L: Negative FADIR (SN 94): R: Negative L: Negative Hip scour (SN 50): R: Negative L: Negative   Physical Performance Measures Deferred this session    TODAY'S TREATMENT: 12/10/23  SUBJECTIVE: Pt reports that he is doing alright today. Achilles tendon has improved somewhat since the last therapy session. He reports some L hip tightness upon arrival today. No specific questions.   PAIN: L hip tightness;   OBJECTIVE:   Ther-ex  R sidelying L manually resisted abduction 3 x 10; R sidelying manually resisted L hip clams 3 x 10;  R sidelying manually resisted L hip reverse clams 3 x 10;  Lateral 6 step downs to Airex pad 2 x 10 BLE; Forward/backward monster walks with blue tband around ankles 30' x 4; Lateral resisted walks with blue tband around ankles 30' x 4; Forward resisted gait with Nautilus 125# x 5; Backwards resisted gait with Nautilus 125# x 2;    Manual Therapy  L hip FABER, FADIR, and single knee to chest stretches 2 x 45s each; R sidelying L lateral hip and lateral thigh STM with theraband roller;   Not performed: Hooklying bridges with blue tband around knees x 10; Hooklying L single leg bridges with blue tband around knees 2 x 10; L sidelying L hip bridges 2 x 10;  PATIENT EDUCATION:  Education details: Pt educated throughout session about proper posture and technique with exercises. Improved exercise technique, movement at target joints, use of target muscles after min to mod verbal, visual, tactile cues.  Person educated: Patient Education method: Explanation and demonstration Education comprehension: verbalized understanding and returned demonstration  HOME EXERCISE PROGRAM:  Access Code: [email or web address] URL:  [email or web address] Date: 11/23/2023 Prepared by: Selinda Eck  Exercises - Hooklying Isometric Hip Abduction with Belt  - 2 x daily - 7 x weekly - 1 sets - 10 reps - 10s hold - Hooklying Isometric Hip Flexion  - 2 x daily - 7 x weekly - 1 sets - 10 reps - 10s hold - Supine 90/90 Isometric Hip Extension  - 2 x daily - 7 x weekly - 1 sets - 10 reps - 10s hold - Forward Monster Walks  - 1 x daily - 7 x weekly - Backward Monster Walks  - 1 x daily - 7 x weekly - Side Stepping with Resistance at Ankles  - 1 x daily - 7 x weekly - Mini Squat with Counter Support  - 2 x daily - 7 x weekly - 2 sets - 10 reps - Standing 3-Way Leg Reach with Resistance at Ankles and Counter Support  - 2 x daily - 7 x weekly - 2 sets - 10 reps   ASSESSMENT:  CLINICAL IMPRESSION: Deferred NuStep again today. Progressed manual therapy and repeated L hip stretches as they proved to be relieving after the last two sessions. Returned to additional standing exercises during session today with some reported hip tightness  but no pain. He does report some L low back pain with resisted backward gait so stopped after 2 reps. Pt encouraged to follow-up as scheduled. He will benefit from PT services to address deficits in strength, ROM, and general mobility in order to return to his PLOF and be able to perform care-taking tasks once his son is born.     OBJECTIVE IMPAIRMENTS: Abnormal gait, decreased activity tolerance, decreased mobility, difficulty walking, decreased ROM, decreased strength, obesity, and pain.   ACTIVITY LIMITATIONS: carrying, lifting, bending, standing, squatting, and locomotion level  PARTICIPATION LIMITATIONS: cleaning, driving, shopping, community activity, and occupation  PERSONAL FACTORS: 3+ comorbidities: HTN, DMT2, OSA, Dyslipidemia, and Obesity are also affecting patient's functional outcome.   REHAB POTENTIAL: Good  CLINICAL DECISION MAKING: Stable/uncomplicated  EVALUATION  COMPLEXITY: Low   GOALS: Goals reviewed with patient? Yes  SHORT TERM GOALS: Target date: 11/30/2023  Pt will be independent with HEP in order to improve strength and decrease hip pain to improve pain-free function at home and work. Baseline: 11/23/23: HEP given  Goal status: INITIAL   LONG TERM GOALS: Target date: 12/28/2023   1.  Pt will decrease worst hip pain by at least 3 points on the NPRS in order to demonstrate clinically significant reduction in hip pain. Baseline: 11/16/23: 7/10 Goal status: INITIAL  3.  Pt will decrease HAGOS score by at least 20 points in order demonstrate clinically significant reduction in hip pain/disability.       Baseline: 11/16/23: 60.6% Goal status: INITIAL  4. Pt will increase hip flexion, hip ABD, and hip ER strength by at least 1/2 MMT grade         in order to demonstrate improvement in strength and function.     Baseline: 11/16/23: 4/5     Goal status: INITIAL   PLAN: PT FREQUENCY: 1-2x/week  PT DURATION: 6 weeks  PLANNED INTERVENTIONS: Therapeutic exercises, Therapeutic activity, Neuromuscular re-education, Balance training, Gait training, Patient/Family education, Self Care, Joint mobilization, Joint manipulation, Vestibular training, Canalith repositioning, Orthotic/Fit training, DME instructions, Dry Needling, Electrical stimulation, Spinal manipulation, Spinal mobilization, Cryotherapy, Moist heat, Taping, Traction, Ultrasound, Ionotophoresis 4mg /ml Dexamethasone, Manual therapy, and Re-evaluation.  PLAN FOR NEXT SESSION: Stairs, continue isometrics, check HEP progress, work Financial controller education  Selinda BIRCH Brityn Mastrogiovanni PT, DPT, GCS  Hamed Debella, PT 12/10/2023, 4:52 PM

## 2023-12-11 NOTE — Therapy (Signed)
 OUTPATIENT PHYSICAL THERAPY HIP TREATMENT   Patient Name: Travis Petty MRN: [id number] DOB:July 25, 1975, 48 y.o., male Today's Date: 12/14/2023  END OF SESSION:  PT End of Session - 12/14/23 1406     Visit Number 7    Number of Visits 13    Date for PT Re-Evaluation 12/28/23    Authorization Type eval: 11/16/23    PT Start Time 1408    PT Stop Time 1453    PT Time Calculation (min) 45 min    Activity Tolerance Patient tolerated treatment well    Behavior During Therapy [facility] for tasks assessed/performed         Past Medical History:  Diagnosis Date   Adenomatous polyps 06/07/2018   Formatting of this note might be different from the original.  Added automatically from request for surgery [id number]     Allergy    Bronchitis 05/25/2018   Last Assessment & Plan:  Formatting of this note might be different from the original. Reviewed hx, sx, exam with pt Proceed with antibx With recent doxycycline and allergies, will proceed with rx levofloxacin. Also included short burst of po steroid. If symptoms not improved in next 3-5 days with treatment/plan recommended, patient was advised to follow up with office for further recommendations.   Cholecystitis    Choledocholithiasis 04/30/2019   Deviated septum    GERD (gastroesophageal reflux disease)    Gout    H/O colonoscopy with polypectomy 05/07/2018   Formatting of this note might be different from the original.  Last Assessment & Plan:   Formatting of this note might be different from the original.  Refer for consideration for repeat colonoscopy  Last Assessment & Plan:   Formatting of this note might be different from the original.  Refer for consideration for repeat colonoscopy     Hypertension    Left Achilles tendinitis 11/04/2018   Last Assessment & Plan:  Formatting of this note might be different from the original. 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 This appears benign just monitor.   Past Surgical History:  Procedure Laterality Date   CHOLECYSTECTOMY  2020   WISDOM TOOTH EXTRACTION     Patient Active Problem List   Diagnosis Date Noted   Greater trochanteric pain syndrome of left lower extremity 09/16/2023   Great toe pain, left 09/16/2023   Long-term (current) use of injectable non-insulin antidiabetic drugs 09/16/2023   Peroneal tendonitis of left lower leg 12/16/2022   Class 2 obesity with alveolar hypoventilation, serious comorbidity, and body mass index (BMI) of 39.0 to 39.9 in adult (HCC) 12/10/2022   Toe pain, right 12/10/2022   Mass of lower outer quadrant of left breast 09/10/2022   Subareolar mass of right breast 09/10/2022   Healthcare maintenance 09/25/2021   OSA on CPAP 05/30/2021   Allergic rhinitis 05/30/2021   Chronic left-sided lumbar radiculopathy 05/30/2021   Other constipation 05/05/2019   Type 2 diabetes mellitus with complication (HCC) 11/04/2018   Colon polyp 05/07/2018   Dyslipidemia 05/07/2018   Primary hypertension 05/07/2018   Chronic rhinitis 04/13/2011   PCP: Alvia Selinda PARAS, MD  REFERRING PROVIDER: Alvia Selinda PARAS, MD  REFERRING DIAG: [phone] (ICD-10-CM) - Greater trochanteric pain syndrome of left lower extremity   RATIONALE FOR EVALUATION  AND TREATMENT: Rehabilitation  THERAPY DIAG: Pain in left hip  Stiffness of left hip, not elsewhere classified  Muscle weakness (generalized)  ONSET DATE: March-April 2025  FOLLOW-UP APPT SCHEDULED WITH REFERRING PROVIDER: No   FROM EVAL 11/16/23:  SUBJECTIVE:                                                                                                                                                                                         SUBJECTIVE STATEMENT:  I just wanna be able to  play with my baby and move around without my hip hurting me  PERTINENT HISTORY:  Pt is an expectant father with his first child to be born in late November of 2025. Pt's goal is to be able to help care for his son once he is born without having pain in his hip. Pt is moving to [place] to be with his fiance and unborn son in August.   Describes his L hip pain as an aching tension/tightness that comes and goes. Pt notices the pain the most when standing on hard or uneven surfaces. Pt reports taking 1 of the prescribed Celebrex  every morning to help with pain relief. Pt says his pain is only located in the lateral hip and does not radiate or refer. He reports no change in sensation and no numbness or tingling.    From Visit with Dr. Alvia on 09/16/23:  He experiences a 'twinge' and tightness in his left hip, particularly when standing up. The pain is located on the outer hip. He suspects his sleeping position may exacerbate the pain. He has not been engaging in specific exercises that might alleviate the issue, such as those from his football days.   Left greater trochanteric pain syndrome-trochanteric bursitis  Left hip pain due to greater trochanteric pain syndrome with muscular imbalance. Symptoms worsen with contact and movement. - Prescribe Celebrex  2 mg anti-inflammatory medication for hip pain.  Take 1-2 times daily on an as-needed basis for pain, take with food. - Refer to physical therapy for targeted exercises. - Consider ultrasound-guided injection if symptoms persist. - Encourage home exercises to strengthen hip stabilizers  PAIN:    Pain Intensity: Present: 4/10, Best: 1/10, Worst: 7/10 Pain location: lateral L hip  Pain Quality: intermittent, aching, and tightness  Radiating: No  Numbness/Tingling: No Focal Weakness: No Aggravating factors: when his achilles tendinitis flares his hip pain flares, not wearing comfy shoes, standing on hard surfaces for long periods of time, standing  on uneven surfaces; Relieving factors: Celebrex , rest 24-hour pain behavior: depends on the activity of the day, usually tighter in the morning How long can you sit:  5 hours How long can you stand: 20-30 mins History of prior back or hip injury, pain, surgery, or therapy: Yes; PT for L lumbar radiculopathy Spring 2023 Dominant hand: right Imaging: No  Red flags: Negative for bowel/bladder changes, saddle paresthesia, testicular pain, h/o kidney stones, h/o spinal tumors, h/o compression fx, personal history of cancer/malignancy (especially prostate or pelvic), acute hip trauma, night pain, h/o abdominal aneurysm, abdominal pain, chills/fever, night sweats, nausea, vomiting, diarrhea, unexplained weight gain/loss;  PRECAUTIONS: None  WEIGHT BEARING RESTRICTIONS: No  FALLS: Has patient fallen in last 6 months? 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  Recent memory is intact.  Remote memory is intact.  Attention span and concentration are intact.  Expressive speech is intact.  Patient's fund of knowledge is within normal limits for educational  level.    Gross Musculoskeletal Assessment Tremor: None; Bulk: Normal, no muscle wasting noted; Tone: Normal;  GAIT: Distance walked: 100 ft Assistive device utilized: None Level of assistance: Complete Independence Comments: wide BOS with bilat hip ER (toe-out)  Posture: Mild kyphotic sitting posture Bilat hip ER in standing (toe-out) Widened BOS  AROM AROM (Normal range in degrees) AROM   Hip Right Left  Flexion (125) 93 93  Extension (15)    Abduction (40) 32 40  Adduction (30) 26 30  Internal Rotation (45) 19 18  External Rotation (45) 41 37      Knee    Flexion (135) WNL WNL  Extension (0) WNL WNL  (* = pain; Blank rows = not tested)  LE MMT: MMT (out of 5) Right  Left   Hip flexion 4/5 4/5*  Hip extension 4+/5 4+/5  Hip abduction 4/5 4/5  Hip adduction 4/5 4/5  Hip internal rotation    Hip external rotation (seated) 4/5 4/5  Knee flexion 4+/5 4+/5  Knee extension 5/5 5/5  Ankle dorsiflexion 5/5 5/5  Ankle plantarflexion Active Active  Ankle inversion    Ankle eversion    (* = pain; Blank rows = not tested)  Sensation Grossly intact to light touch throughout bilateral LEs as determined by testing dermatomes L2-S2. Proprioception, stereognosis, and hot/cold testing deferred on this date.  Reflexes Not tested  Muscle Length Not tested  Palpation Location Right Left         Lumbar paraspinals 0 0  Quadratus Lumborum 0 0  Iliac Crest 0 0  ASIS    Pubic Rami    Pubic symphysis   Femoral Triangle    Inguinal ligament    Gluteus Maximus    Gluteus Medius  1  Deep hip external rotators    Sacrum   PSIS    Fortin's  Area (SIJ)    Coccyx   Ischial Tuberosity    Greater Trochanter  1  (Blank rows = not tested) Graded on 0-4 scale (0 = no pain, 1 = pain, 2 = pain with wincing/grimacing/flinching, 3 = pain with withdrawal, 4 = unwilling to allow palpation)  Passive Accessory Intervertebral Motion Not Tested  Special Tests Lumbar  Radiculopathy and Discogenic: Centralization and Peripheralization (SN 92, -LR 0.12): Not done Slump (SN 83, -LR 0.32): R: Negative L: Negative SLR (SN 92, -LR 0.29): R: Not done L:  Not done  Hip: FABER (SN 81): R: Negative L: Negative FADIR (SN 94): R: Negative L: Negative Hip scour (SN 50): R: Negative L: Negative   Physical Performance Measures Deferred this session    TODAY'S TREATMENT: 12/14/23  SUBJECTIVE: Pt reports that he is doing alright today. Achilles tendon has improved but he still has intermittent pain. He reports some L hip tightness upon arrival today but no pain. No specific questions upon arrival.   PAIN: L hip tightness but no pain;   OBJECTIVE:   Ther-ex  Hooklying clams with manual resistance from therapist 2 x 15; Hooklying adductor squeeze with manual resistance from therapist 2 x 15; Hooklying bridges with blue tband around knees 2 x 15; Hooklying L single leg bridges with blue tband around knees 2 x 10; R sidelying L manually resisted abduction 3 x 10; R sidelying manually resisted L hip reverse clams 3 x 10;    Manual Therapy  L hip FABER, FADIR, and single knee to chest stretches 2 x 45s each; R sidelying L lateral hip and lateral thigh STM with theraband roller; L hip belt assisted mobilizations, grade III-IV, long axis distraction, inferior at 90 flexion, and inferior in FABER position, 30s/bout x 3 bouts each;   Not performed: Lateral 6 step downs to Airex pad 2 x 10 BLE; Forward/backward monster walks with blue tband around ankles 30' x 4; Lateral resisted walks with blue tband around ankles 30' x 4; Forward resisted gait with Nautilus 125# x 5; Backwards resisted gait with Nautilus 125# x 2;    PATIENT EDUCATION:  Education details: Pt educated throughout session about proper posture and technique with exercises. Improved exercise technique, movement at target joints, use of target muscles after min to mod verbal, visual, tactile  cues.  Person educated: Patient Education method: Explanation and demonstration Education comprehension: verbalized understanding and returned demonstration  HOME EXERCISE PROGRAM:  Access Code: [email or web address] URL: [email or web address] Date: 11/23/2023 Prepared by: Selinda Eck  Exercises - Hooklying Isometric Hip Abduction with Belt  - 2 x daily - 7 x weekly - 1 sets - 10 reps - 10s hold - Hooklying Isometric Hip Flexion  - 2 x daily - 7 x weekly - 1 sets - 10 reps - 10s hold - Supine 90/90 Isometric Hip Extension  - 2 x daily - 7 x weekly - 1 sets - 10 reps - 10s hold - Forward Monster Walks  - 1 x daily - 7 x weekly - Backward Monster Walks  - 1 x daily - 7 x weekly - Side Stepping with Resistance at Ankles  - 1 x daily - 7 x weekly - Mini Squat with Counter Support  - 2 x daily - 7 x weekly - 2 sets - 10 reps - Standing 3-Way Leg Reach with Resistance at Ankles and Counter Support  - 2 x daily - 7 x weekly - 2 sets - 10 reps   ASSESSMENT:  CLINICAL IMPRESSION:  Deferred NuStep again today in order to avoid aggravating achilles tendon. Progressed manual therapy and repeated L hip stretches as they proved to be relieving after the last two sessions. Introduced belt assisted mobilizations. Progressed mat table exercises as pt reports some achilles aggravation after exercises in standing during last session. Pt encouraged to follow-up as scheduled. He will benefit from PT services to address deficits in strength, ROM, and general mobility in order to return to his PLOF and be able to perform care-taking tasks once his son is born.     OBJECTIVE IMPAIRMENTS: Abnormal gait, decreased activity tolerance, decreased mobility, difficulty walking, decreased ROM, decreased strength, obesity, and pain.   ACTIVITY LIMITATIONS: carrying, lifting, bending, standing, squatting, and locomotion level  PARTICIPATION LIMITATIONS: cleaning, driving, shopping, community activity, and  occupation  PERSONAL FACTORS: 3+ comorbidities: HTN, DMT2, OSA, Dyslipidemia, and Obesity are also affecting patient's functional outcome.   REHAB POTENTIAL: Good  CLINICAL DECISION MAKING: Stable/uncomplicated  EVALUATION COMPLEXITY: Low   GOALS: Goals reviewed with patient? Yes  SHORT TERM GOALS: Target date: 11/30/2023  Pt will be independent with HEP in order to improve strength and decrease hip pain to improve pain-free function at home and work. Baseline: 11/23/23: HEP given  Goal status: INITIAL   LONG TERM GOALS: Target date: 12/28/2023   1.  Pt will decrease worst hip pain by at least 3 points on the NPRS in order to demonstrate clinically significant reduction in hip pain. Baseline: 11/16/23: 7/10 Goal status: INITIAL  3.  Pt will decrease HAGOS score by at least 20 points in order demonstrate clinically significant reduction in hip pain/disability.       Baseline: 11/16/23: 60.6% Goal status: INITIAL  4. Pt will increase hip flexion, hip ABD, and hip ER strength by at least 1/2 MMT grade         in order to demonstrate improvement in strength and function.     Baseline: 11/16/23: 4/5     Goal status: INITIAL   PLAN: PT FREQUENCY: 1-2x/week  PT DURATION: 6 weeks  PLANNED INTERVENTIONS: Therapeutic exercises, Therapeutic activity, Neuromuscular re-education, Balance training, Gait training, Patient/Family education, Self Care, Joint mobilization, Joint manipulation, Vestibular training, Canalith repositioning, Orthotic/Fit training, DME instructions, Dry Needling, Electrical stimulation, Spinal manipulation, Spinal mobilization, Cryotherapy, Moist heat, Taping, Traction, Ultrasound, Ionotophoresis 4mg /ml Dexamethasone, Manual therapy, and Re-evaluation.  PLAN FOR NEXT SESSION: Stairs, continue isometrics, check HEP progress, work Office manager D Kirstina Leinweber PT, DPT, [employer]  Kamarie Palma, PT 12/14/2023, 3:23 PM

## 2023-12-14 ENCOUNTER — Ambulatory Visit

## 2023-12-14 DIAGNOSIS — M6281 Muscle weakness (generalized): Secondary | ICD-10-CM

## 2023-12-14 DIAGNOSIS — M25552 Pain in left hip: Secondary | ICD-10-CM | POA: Diagnosis not present

## 2023-12-14 DIAGNOSIS — M25652 Stiffness of left hip, not elsewhere classified: Secondary | ICD-10-CM

## 2023-12-15 ENCOUNTER — Encounter: Payer: Self-pay | Admitting: Family Medicine

## 2023-12-15 ENCOUNTER — Ambulatory Visit (INDEPENDENT_AMBULATORY_CARE_PROVIDER_SITE_OTHER): Admitting: Family Medicine

## 2023-12-15 VITALS — BP 124/82 | HR 72 | Ht 73.0 in | Wt 316.6 lb

## 2023-12-15 DIAGNOSIS — E559 Vitamin D deficiency, unspecified: Secondary | ICD-10-CM

## 2023-12-15 DIAGNOSIS — M25552 Pain in left hip: Secondary | ICD-10-CM | POA: Diagnosis not present

## 2023-12-15 DIAGNOSIS — S29011A Strain of muscle and tendon of front wall of thorax, initial encounter: Secondary | ICD-10-CM | POA: Insufficient documentation

## 2023-12-15 DIAGNOSIS — M5416 Radiculopathy, lumbar region: Secondary | ICD-10-CM

## 2023-12-15 DIAGNOSIS — J309 Allergic rhinitis, unspecified: Secondary | ICD-10-CM

## 2023-12-15 DIAGNOSIS — Z7984 Long term (current) use of oral hypoglycemic drugs: Secondary | ICD-10-CM

## 2023-12-15 DIAGNOSIS — E785 Hyperlipidemia, unspecified: Secondary | ICD-10-CM

## 2023-12-15 DIAGNOSIS — M10079 Idiopathic gout, unspecified ankle and foot: Secondary | ICD-10-CM

## 2023-12-15 DIAGNOSIS — E118 Type 2 diabetes mellitus with unspecified complications: Secondary | ICD-10-CM

## 2023-12-15 MED ORDER — FLUTICASONE PROPIONATE 50 MCG/ACT NA SUSP: 1.0000 | Freq: Every day | NASAL | 3 refills | Status: AC

## 2023-12-15 MED ORDER — LEVOCETIRIZINE DIHYDROCHLORIDE 5 MG PO TABS: 5.0000 mg | ORAL_TABLET | Freq: Every evening | ORAL | 3 refills | Status: AC

## 2023-12-15 MED ORDER — CYCLOBENZAPRINE HCL 10 MG PO TABS
10.0000 mg | ORAL_TABLET | Freq: Three times a day (TID) | ORAL | 0 refills | Status: AC | PRN
Start: 2023-12-15 — End: ?

## 2023-12-15 NOTE — Assessment & Plan Note (Signed)
 Posterior right chest wall pain - Intermittent pain triggered by sneezing - Pain attributed to muscle strain - Area is tight and tender to touch - Uses Celebrex  infrequently for pain management  Intercostal muscle strain Intermittent back pain from intercostal muscle strain, tender to touch, discomfort with deep breaths. - Apply heat to affected area. - Prescribe cyclobenzaprine  muscle relaxer to use as needed for pain.  Side effects can be drowsiness. - If symptoms fail to improve, contact us  for next steps.

## 2023-12-15 NOTE — Patient Instructions (Signed)
 Patient Plan for Post-Visit Guidance  Allergic Rhinitis - Prescription for levocetirizine (Xyzal ) sent to [facility]. - Prescription for Flonase  sent to [facility]. - Take levocetirizine daily and use Flonase  as needed for allergy symptoms.  Left Greater Trochanteric Pain Syndrome (Left Hip Pain) - Continue physical therapy, focusing on stretching and strengthening exercises. - Use cyclobenzaprine  as needed for muscle pain. This medication may cause drowsiness.  Intercostal Muscle Strain (Right Posterior Chest Wall Pain) - Apply heat to the affected area as needed. - Use cyclobenzaprine  as needed for pain. This medication may cause drowsiness. - If pain does not improve, contact the office for further instructions.  Chronic Left-Sided Lumbar Radiculopathy - Continue current management as directed.  Dyslipidemia - Lipid panel ordered. Follow up on results as directed.  Type 2 Diabetes Mellitus - Comprehensive metabolic panel and Hemoglobin J8r ordered. Follow up on results as directed.  Red Flags - If you experience severe or sudden pain, swelling, redness, shortness of breath, chest pain, weakness, numbness, or loss of movement, seek medical attention promptly.

## 2023-12-15 NOTE — Assessment & Plan Note (Signed)
 Allergic rhinitis symptoms - Manages nasal allergy symptoms with levocetirizine and Flonase  - Difficulty obtaining medication refills - Medications are effective when taken regularly - Takes levocetirizine daily and uses Flonase  as needed  Allergic rhinitis Chronic allergic rhinitis managed with levocetirizine and Flonase . Issues with refills noted. - Send prescription for levocetirizine (Xyzal ) to [facility] - Send prescription for Flonase  to [facility].

## 2023-12-15 NOTE — Progress Notes (Signed)
 Primary Care / Sports Medicine Office Visit  Patient Information:  Patient ID: Travis Petty, male DOB: 08-12-1975 Age: 48 y.o. MRN: [id number]   Chritopher Petty is a pleasant 48 y.o. male presenting with the following:  Chief Complaint  Patient presents with   Hip Pain    Patient following up on left hip pain. He has been in physical therapy and hip is doing much better.    Vitals:   12/15/23 0910  BP: 124/82  Pulse: 72  SpO2: 98%   Vitals:   12/15/23 0910  Weight: (!) 316 lb 9.6 oz (143.6 kg)  Height: 6' 1 (1.854 m)   Body mass index is 41.77 kg/m.  No results found.   Independent interpretation of notes and tests performed by another provider:   None  Procedures performed:   None  Pertinent History, Exam, Impression, and Recommendations:   Problem List Items Addressed This Visit     Allergic rhinitis   Allergic rhinitis symptoms - Manages nasal allergy symptoms with levocetirizine and Flonase  - Difficulty obtaining medication refills - Medications are effective when taken regularly - Takes levocetirizine daily and uses Flonase  as needed  Allergic rhinitis Chronic allergic rhinitis managed with levocetirizine and Flonase . Issues with refills noted. - Send prescription for levocetirizine (Xyzal ) to [facility] - Send prescription for Flonase  to [facility].      Chronic left-sided lumbar radiculopathy   Relevant Medications   cyclobenzaprine  (FLEXERIL ) 10 MG tablet   Dyslipidemia   Relevant Orders   Lipid panel   Greater trochanteric pain syndrome of left lower extremity - Primary   Lateral hip pain - Ongoing pain localized to the outer back and hip region - No groin involvement - Pain improving with regular physical therapy sessions - Some physical therapy exercises aggravate the Achilles tendon - Uses a pillow between knees to aid weight distribution  Physical Exam VITALS: BP- 155/105 MEASUREMENTS: Weight- 316. PALPATION: Tenderness at the  left greater trochanter. Tenderness and tightness in the low left back. SPECIAL TESTS: Negative FABER test on the left.  Left greater trochanteric pain syndrome Chronic left hip pain from gluteus medius dysfunction, improving with physical therapy. - Continue physical therapy with focus on stretching and strengthening. - Consider cyclobenzaprine  muscle relaxer for muscle-based pain if needed.  Can cause drowsiness.      Intercostal muscle strain   Posterior right chest wall pain - Intermittent pain triggered by sneezing - Pain attributed to muscle strain - Area is tight and tender to touch - Uses Celebrex  infrequently for pain management  Intercostal muscle strain Intermittent back pain from intercostal muscle strain, tender to touch, discomfort with deep breaths. - Apply heat to affected area. - Prescribe cyclobenzaprine  muscle relaxer to use as needed for pain.  Side effects can be drowsiness. - If symptoms fail to improve, contact us  for next steps.      Type 2 diabetes mellitus with complication (HCC)   Relevant Orders   Comprehensive metabolic panel with GFR   Hemoglobin A1c   Other Visit Diagnoses       Idiopathic gout involving toe, unspecified chronicity, unspecified laterality       Relevant Medications   cyclobenzaprine  (FLEXERIL ) 10 MG tablet   Other Relevant Orders   CBC   Uric acid     Vitamin D  deficiency       Relevant Orders   VITAMIN D  25 Hydroxy (Vit-D Deficiency, Fractures)        Orders & Medications Medications:  Meds ordered this encounter  Medications   levocetirizine (XYZAL ) 5 MG tablet    Sig: Take 1 tablet (5 mg total) by mouth every evening.    Dispense:  90 tablet    Refill:  3   fluticasone  (FLONASE ) 50 MCG/ACT nasal spray    Sig: Place 1 spray into both nostrils daily.    Dispense:  16 g    Refill:  3   cyclobenzaprine  (FLEXERIL ) 10 MG tablet    Sig: Take 1 tablet (10 mg total) by mouth 3 (three) times daily as needed for muscle  spasms.    Dispense:  30 tablet    Refill:  0   Orders Placed This Encounter  Procedures   CBC   Comprehensive metabolic panel with GFR   Lipid panel   VITAMIN D  25 Hydroxy (Vit-D Deficiency, Fractures)   Uric acid   Hemoglobin A1c     No follow-ups on file.     Selinda JINNY Ku, MD, [facility]   Primary Care Sports Medicine Primary Care and Sports Medicine at [facility]

## 2023-12-15 NOTE — Assessment & Plan Note (Signed)
 Lateral hip pain - Ongoing pain localized to the outer back and hip region - No groin involvement - Pain improving with regular physical therapy sessions - Some physical therapy exercises aggravate the Achilles tendon - Uses a pillow between knees to aid weight distribution  Physical Exam VITALS: BP- 155/105 MEASUREMENTS: Weight- 316. PALPATION: Tenderness at the left greater trochanter. Tenderness and tightness in the low left back. SPECIAL TESTS: Negative FABER test on the left.  Left greater trochanteric pain syndrome Chronic left hip pain from gluteus medius dysfunction, improving with physical therapy. - Continue physical therapy with focus on stretching and strengthening. - Consider cyclobenzaprine  muscle relaxer for muscle-based pain if needed.  Can cause drowsiness.

## 2023-12-16 ENCOUNTER — Encounter

## 2023-12-17 ENCOUNTER — Ambulatory Visit: Admitting: Family Medicine

## 2023-12-17 ENCOUNTER — Encounter

## 2023-12-21 ENCOUNTER — Encounter

## 2023-12-23 ENCOUNTER — Encounter

## 2023-12-23 NOTE — Therapy (Signed)
 OUTPATIENT PHYSICAL THERAPY HIP TREATMENT   Patient Name: Travis Petty MRN: [id number] DOB:[birth year], 48 y.o., male Today's Date: 12/29/2023  END OF SESSION:  PT End of Session - 12/28/23 1358     Visit Number 8    Number of Visits 13    Date for PT Re-Evaluation 12/28/23    Authorization Type eval: 11/16/23    PT Start Time 1400    PT Stop Time 1445    PT Time Calculation (min) 45 min    Activity Tolerance Patient tolerated treatment well    Behavior During Therapy [facility] for tasks assessed/performed         Past Medical History:  Diagnosis Date   Adenomatous polyps 06/07/2018   Formatting of this note might be different from the original.  Added automatically from request for surgery [id number]     Allergy    Bronchitis 05/25/2018   Last Assessment & Plan:  Formatting of this note might be different from the original. Reviewed hx, sx, exam with pt Proceed with antibx With recent doxycycline and allergies, will proceed with rx levofloxacin. Also included short burst of po steroid. If symptoms not improved in next 3-5 days with treatment/plan recommended, patient was advised to follow up with office for further recommendations.   Cholecystitis    Choledocholithiasis 04/30/2019   Deviated septum    GERD (gastroesophageal reflux disease)    Gout    H/O colonoscopy with polypectomy 05/07/2018   Formatting of this note might be different from the original.  Last Assessment & Plan:   Formatting of this note might be different from the original.  Refer for consideration for repeat colonoscopy  Last Assessment & Plan:   Formatting of this note might be different from the original.  Refer for consideration for repeat colonoscopy     Hypertension    Left Achilles tendinitis 11/04/2018   Last Assessment & Plan:  Formatting of this note might be different from the original. 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 This appears benign just monitor.   Past Surgical History:  Procedure Laterality Date   CHOLECYSTECTOMY  2020   WISDOM TOOTH EXTRACTION     Patient Active Problem List   Diagnosis Date Noted   Intercostal muscle strain 12/15/2023   Greater trochanteric pain syndrome of left lower extremity 09/16/2023   Great toe pain, left 09/16/2023   Long-term (current) use of injectable non-insulin antidiabetic drugs 09/16/2023   Peroneal tendonitis of left lower leg 12/16/2022   Class 2 obesity with alveolar hypoventilation, serious comorbidity, and body mass index (BMI) of 39.0 to 39.9 in adult (HCC) 12/10/2022   Toe pain, right 12/10/2022   Mass of lower outer quadrant of left breast 09/10/2022   Subareolar mass of right breast 09/10/2022   Healthcare maintenance 09/25/2021   OSA on CPAP 05/30/2021   Allergic rhinitis 05/30/2021   Chronic left-sided lumbar radiculopathy 05/30/2021   Other constipation 05/05/2019   Type 2 diabetes mellitus with complication (HCC) 11/04/2018   Colon polyp 05/07/2018   Dyslipidemia 05/07/2018   Primary hypertension 05/07/2018   Chronic rhinitis 04/13/2011   PCP: Alvia Selinda PARAS, MD  REFERRING PROVIDER: Alvia Selinda PARAS, MD  REFERRING DIAG: [phone] (ICD-10-CM) - Greater trochanteric pain syndrome of left lower  extremity   RATIONALE FOR EVALUATION AND TREATMENT: Rehabilitation  THERAPY DIAG: Pain in left hip  Stiffness of left hip, not elsewhere classified  Muscle weakness (generalized)  ONSET DATE: March-April 2025  FOLLOW-UP APPT SCHEDULED WITH REFERRING PROVIDER: No   FROM EVAL 11/16/23:  SUBJECTIVE:                                                                                                                                                                                          SUBJECTIVE STATEMENT:  I just wanna be able to play with my baby and move around without my hip hurting me  PERTINENT HISTORY:  Pt is an expectant father with his first child to be born in late November of 2025. Pt's goal is to be able to help care for his son once he is born without having pain in his hip. Pt is moving to [place] to be with his fiance and unborn son in August.   Describes his L hip pain as an aching tension/tightness that comes and goes. Pt notices the pain the most when standing on hard or uneven surfaces. Pt reports taking 1 of the prescribed Celebrex  every morning to help with pain relief. Pt says his pain is only located in the lateral hip and does not radiate or refer. He reports no change in sensation and no numbness or tingling.    From Visit with Dr. Alvia on 09/16/23:  He experiences a 'twinge' and tightness in his left hip, particularly when standing up. The pain is located on the outer hip. He suspects his sleeping position may exacerbate the pain. He has not been engaging in specific exercises that might alleviate the issue, such as those from his football days.   Left greater trochanteric pain syndrome-trochanteric bursitis  Left hip pain due to greater trochanteric pain syndrome with muscular imbalance. Symptoms worsen with contact and movement. - Prescribe Celebrex  2 mg anti-inflammatory medication for hip pain.  Take 1-2 times daily on an as-needed basis for pain, take with food. - Refer to physical therapy for targeted exercises. - Consider ultrasound-guided injection if symptoms persist. - Encourage home exercises to strengthen hip stabilizers  PAIN:    Pain Intensity: Present: 4/10, Best: 1/10, Worst: 7/10 Pain location: lateral L hip  Pain Quality: intermittent, aching, and tightness  Radiating: No  Numbness/Tingling: No Focal Weakness: No Aggravating factors: when his achilles tendinitis flares his hip pain flares, not wearing comfy shoes, standing  on hard surfaces for long periods of time, standing on uneven surfaces; Relieving factors: Celebrex , rest 24-hour pain behavior: depends on the activity of the day, usually tighter in the  morning How long can you sit: 5 hours How long can you stand: 20-30 mins History of prior back or hip injury, pain, surgery, or therapy: Yes; PT for L lumbar radiculopathy Spring 2023 Dominant hand: right Imaging: No  Red flags: Negative for bowel/bladder changes, saddle paresthesia, testicular pain, h/o kidney stones, h/o spinal tumors, h/o compression fx, personal history of cancer/malignancy (especially prostate or pelvic), acute hip trauma, night pain, h/o abdominal aneurysm, abdominal pain, chills/fever, night sweats, nausea, vomiting, diarrhea, unexplained weight gain/loss;  PRECAUTIONS: None  WEIGHT BEARING RESTRICTIONS: No  FALLS: Has patient fallen in last 6 months? 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  Recent memory is intact.  Remote memory is intact.  Attention span and concentration are intact.  Expressive speech is intact.  Patient's fund of knowledge is  within normal limits for educational level.    Gross Musculoskeletal Assessment Tremor: None; Bulk: Normal, no muscle wasting noted; Tone: Normal;  GAIT: Distance walked: 100 ft Assistive device utilized: None Level of assistance: Complete Independence Comments: wide BOS with bilat hip ER (toe-out)  Posture: Mild kyphotic sitting posture Bilat hip ER in standing (toe-out) Widened BOS  AROM AROM (Normal range in degrees) AROM   Hip Right Left  Flexion (125) 93 93  Extension (15)    Abduction (40) 32 40  Adduction (30) 26 30  Internal Rotation (45) 19 18  External Rotation (45) 41 37      Knee    Flexion (135) WNL WNL  Extension (0) WNL WNL  (* = pain; Blank rows = not tested)  LE MMT: MMT (out of 5) Right  Left   Hip flexion 4/5 4/5*  Hip extension 4+/5 4+/5  Hip abduction 4/5 4/5  Hip adduction 4/5 4/5  Hip internal rotation    Hip external rotation (seated) 4/5 4/5  Knee flexion 4+/5 4+/5  Knee extension 5/5 5/5  Ankle dorsiflexion 5/5 5/5  Ankle plantarflexion Active Active  Ankle inversion    Ankle eversion    (* = pain; Blank rows = not tested)  Sensation Grossly intact to light touch throughout bilateral LEs as determined by testing dermatomes L2-S2. Proprioception, stereognosis, and hot/cold testing deferred on this date.  Reflexes Not tested  Muscle Length Not tested  Palpation Location Right Left         Lumbar paraspinals 0 0  Quadratus Lumborum 0 0  Iliac Crest 0 0  ASIS    Pubic Rami    Pubic symphysis   Femoral Triangle    Inguinal ligament    Gluteus Maximus    Gluteus Medius  1  Deep hip external rotators    Sacrum  PSIS    Fortin's Area (SIJ)    Coccyx   Ischial Tuberosity    Greater Trochanter  1  (Blank rows = not tested) Graded on 0-4 scale (0 = no pain, 1 = pain, 2 = pain with wincing/grimacing/flinching, 3 = pain with withdrawal, 4 = unwilling to allow palpation)  Passive Accessory Intervertebral Motion Not  Tested  Special Tests Lumbar Radiculopathy and Discogenic: Centralization and Peripheralization (SN 92, -LR 0.12): Not done Slump (SN 83, -LR 0.32): R: Negative L: Negative SLR (SN 92, -LR 0.29): R: Not done L:  Not done  Hip: FABER (SN 81): R: Negative L: Negative FADIR (SN 94): R: Negative L: Negative Hip scour (SN 50): R: Negative L: Negative   Physical Performance Measures Deferred this session    TODAY'S TREATMENT: 12/28/23   SUBJECTIVE: Pt reports that he is doing alright today. Achilles tendon has improved. He states that he walked a lot while in [place] without any hip pain. No resting hip pain upon arrival today and no specific questions.   PAIN: Denies   OBJECTIVE:   Ther-ex  NuStep L1-4 x 5 minutes for hip ROM and BLE strengthening during interval history; Hooklying bridge marches 2 x 5; R sidelying L manually resisted clams 2 x 10; R sidelying manually resisted L hip reverse clams 2 x 10;  R sidelying L manually resisted abduction x 10, DC second set due to pain;   Manual Therapy  L hip FABER, FADIR, and single knee to chest stretches 2 x 45s each; R sidelying L lateral hip and lateral thigh STM with theraband roller;   Therapeutic Activity  Forward/backward monster walks with blue tband around ankles 30' x 4; Lateral resisted walks with blue tband around ankles 30' x 4;   Not performed: Lateral 6 step downs to Airex pad 2 x 10 BLE; Forward resisted gait with Nautilus 125# x 5; Backwards resisted gait with Nautilus 125# x 2;  L hip belt assisted mobilizations, grade III-IV, long axis distraction, inferior at 90 flexion, and inferior in FABER position, 30s/bout x 3 bouts each;   PATIENT EDUCATION:  Education details: Pt educated throughout session about proper posture and technique with exercises. Improved exercise technique, movement at target joints, use of target muscles after min to mod verbal, visual, tactile cues.  Person educated:  Patient Education method: Explanation and demonstration Education comprehension: verbalized understanding and returned demonstration  HOME EXERCISE PROGRAM:  Access Code: [email or web address] URL: [email or web address] Date: 11/23/2023 Prepared by: Selinda Eck  Exercises - Hooklying Isometric Hip Abduction with Belt  - 2 x daily - 7 x weekly - 1 sets - 10 reps - 10s hold - Hooklying Isometric Hip Flexion  - 2 x daily - 7 x weekly - 1 sets - 10 reps - 10s hold - Supine 90/90 Isometric Hip Extension  - 2 x daily - 7 x weekly - 1 sets - 10 reps - 10s hold - Forward Monster Walks  - 1 x daily - 7 x weekly - Backward Monster Walks  - 1 x daily - 7 x weekly - Side Stepping with Resistance at Ankles  - 1 x daily - 7 x weekly - Mini Squat with Counter Support  - 2 x daily - 7 x weekly - 2 sets - 10 reps - Standing 3-Way Leg Reach with Resistance at Ankles and Counter Support  - 2 x daily - 7 x weekly - 2 sets - 10 reps   ASSESSMENT:  CLINICAL IMPRESSION: Reintroduced  NuStep today for ROM and strengthening without any increase in pain. Progressed mat table exercises as well as standing functional strengthening. Pt struggles with bridge marches. He reports pain today with resisted sidelying L hip abduction so discontinued. Plan to progress strengthening at future sessions. He will be ready for discharge over the next couple visits once he is able to demonstrate full strengthening without pain. He will also need updated outcome measures/goals at next visit. Pt encouraged to follow-up as scheduled. He will benefit from PT services to address deficits in strength, ROM, and general mobility in order to return to his PLOF and be able to perform care-taking tasks once his son is born.     OBJECTIVE IMPAIRMENTS: Abnormal gait, decreased activity tolerance, decreased mobility, difficulty walking, decreased ROM, decreased strength, obesity, and pain.   ACTIVITY LIMITATIONS: carrying, lifting, bending,  standing, squatting, and locomotion level  PARTICIPATION LIMITATIONS: cleaning, driving, shopping, community activity, and occupation  PERSONAL FACTORS: 3+ comorbidities: HTN, DMT2, OSA, Dyslipidemia, and Obesity are also affecting patient's functional outcome.   REHAB POTENTIAL: Good  CLINICAL DECISION MAKING: Stable/uncomplicated  EVALUATION COMPLEXITY: Low   GOALS: Goals reviewed with patient? Yes  SHORT TERM GOALS: Target date: 11/30/2023  Pt will be independent with HEP in order to improve strength and decrease hip pain to improve pain-free function at home and work. Baseline: 11/23/23: HEP given  Goal status: INITIAL   LONG TERM GOALS: Target date: 12/28/2023   1.  Pt will decrease worst hip pain by at least 3 points on the NPRS in order to demonstrate clinically significant reduction in hip pain. Baseline: 11/16/23: 7/10 Goal status: INITIAL  3.  Pt will decrease HAGOS score by at least 20 points in order demonstrate clinically significant reduction in hip pain/disability.       Baseline: 11/16/23: 60.6% Goal status: INITIAL  4. Pt will increase hip flexion, hip ABD, and hip ER strength by at least 1/2 MMT grade         in order to demonstrate improvement in strength and function.     Baseline: 11/16/23: 4/5     Goal status: INITIAL   PLAN: PT FREQUENCY: 1-2x/week  PT DURATION: 6 weeks  PLANNED INTERVENTIONS: Therapeutic exercises, Therapeutic activity, Neuromuscular re-education, Balance training, Gait training, Patient/Family education, Self Care, Joint mobilization, Joint manipulation, Vestibular training, Canalith repositioning, Orthotic/Fit training, DME instructions, Dry Needling, Electrical stimulation, Spinal manipulation, Spinal mobilization, Cryotherapy, Moist heat, Taping, Traction, Ultrasound, Ionotophoresis 4mg /ml Dexamethasone, Manual therapy, and Re-evaluation.  PLAN FOR NEXT SESSION: Update outcome measures/goals, stairs, continue isometrics, check HEP  progress, work ergonomics education  Deja Kaigler D Stashia Sia PT, DPT, GCS  Everlynn Sagun, PT 12/29/2023, 8:56 AM

## 2023-12-24 ENCOUNTER — Encounter

## 2023-12-28 ENCOUNTER — Ambulatory Visit

## 2023-12-28 DIAGNOSIS — M25552 Pain in left hip: Secondary | ICD-10-CM | POA: Diagnosis not present

## 2023-12-28 DIAGNOSIS — M25652 Stiffness of left hip, not elsewhere classified: Secondary | ICD-10-CM

## 2023-12-28 DIAGNOSIS — M6281 Muscle weakness (generalized): Secondary | ICD-10-CM

## 2023-12-30 ENCOUNTER — Encounter

## 2023-12-31 ENCOUNTER — Ambulatory Visit

## 2024-01-08 NOTE — Therapy (Incomplete)
 OUTPATIENT PHYSICAL THERAPY HIP TREATMENT/RECERTIFICATION   Patient Name: Travis Petty MRN: [id number] DOB:February 12, 1976, 48 y.o., male Today's Date: 01/08/2024  END OF SESSION:   Past Medical History:  Diagnosis Date   Adenomatous polyps 06/07/2018   Formatting of this note might be different from the original.  Added automatically from request for surgery [id number]     Allergy    Bronchitis 05/25/2018   Last Assessment & Plan:  Formatting of this note might be different from the original. Reviewed hx, sx, exam with pt Proceed with antibx With recent doxycycline and allergies, will proceed with rx levofloxacin. Also included short burst of po steroid. If symptoms not improved in next 3-5 days with treatment/plan recommended, patient was advised to follow up with office for further recommendations.   Cholecystitis    Choledocholithiasis 04/30/2019   Deviated septum    GERD (gastroesophageal reflux disease)    Gout    H/O colonoscopy with polypectomy 05/07/2018   Formatting of this note might be different from the original.  Last Assessment & Plan:   Formatting of this note might be different from the original.  Refer for consideration for repeat colonoscopy  Last Assessment & Plan:   Formatting of this note might be different from the original.  Refer for consideration for repeat colonoscopy     Hypertension    Left Achilles tendinitis 11/04/2018   Last Assessment & Plan:  Formatting of this note might be different from the original. 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 This appears benign just monitor.   Past Surgical History:  Procedure Laterality Date   CHOLECYSTECTOMY  2020   WISDOM TOOTH EXTRACTION     Patient Active Problem List   Diagnosis Date  Noted   Intercostal muscle strain 12/15/2023   Greater trochanteric pain syndrome of left lower extremity 09/16/2023   Great toe pain, left 09/16/2023   Long-term (current) use of injectable non-insulin antidiabetic drugs 09/16/2023   Peroneal tendonitis of left lower leg 12/16/2022   Class 2 obesity with alveolar hypoventilation, serious comorbidity, and body mass index (BMI) of 39.0 to 39.9 in adult (HCC) 12/10/2022   Toe pain, right 12/10/2022   Mass of lower outer quadrant of left breast 09/10/2022   Subareolar mass of right breast 09/10/2022   Healthcare maintenance 09/25/2021   OSA on CPAP 05/30/2021   Allergic rhinitis 05/30/2021   Chronic left-sided lumbar radiculopathy 05/30/2021   Other constipation 05/05/2019   Type 2 diabetes mellitus with complication (HCC) 11/04/2018   Colon polyp 05/07/2018   Dyslipidemia 05/07/2018   Primary hypertension 05/07/2018   Chronic rhinitis 04/13/2011   PCP: Alvia Selinda PARAS, MD  REFERRING PROVIDER: Alvia Selinda PARAS, MD  REFERRING DIAG: [phone] (ICD-10-CM) - Greater trochanteric pain syndrome of left lower extremity   RATIONALE FOR EVALUATION AND TREATMENT: Rehabilitation  THERAPY DIAG: Pain in left hip  Stiffness of left hip, not elsewhere classified  Muscle weakness (generalized)  ONSET DATE: March-April 2025  FOLLOW-UP APPT SCHEDULED WITH REFERRING PROVIDER: No   FROM EVAL 11/16/23:  SUBJECTIVE:  SUBJECTIVE STATEMENT:  I just wanna be able to play with my baby and move around without my hip hurting me  PERTINENT HISTORY:  Pt is an expectant father with his first child to be born in late November of 2025. Pt's goal is to be able to help care for his son once he is born without having pain in his hip. Pt is moving to [place] to be with his fiance and  unborn son in August.   Describes his L hip pain as an aching tension/tightness that comes and goes. Pt notices the pain the most when standing on hard or uneven surfaces. Pt reports taking 1 of the prescribed Celebrex  every morning to help with pain relief. Pt says his pain is only located in the lateral hip and does not radiate or refer. He reports no change in sensation and no numbness or tingling.    From Visit with Dr. Alvia on 09/16/23:  He experiences a 'twinge' and tightness in his left hip, particularly when standing up. The pain is located on the outer hip. He suspects his sleeping position may exacerbate the pain. He has not been engaging in specific exercises that might alleviate the issue, such as those from his football days.   Left greater trochanteric pain syndrome-trochanteric bursitis  Left hip pain due to greater trochanteric pain syndrome with muscular imbalance. Symptoms worsen with contact and movement. - Prescribe Celebrex  2 mg anti-inflammatory medication for hip pain.  Take 1-2 times daily on an as-needed basis for pain, take with food. - Refer to physical therapy for targeted exercises. - Consider ultrasound-guided injection if symptoms persist. - Encourage home exercises to strengthen hip stabilizers  PAIN:    Pain Intensity: Present: 4/10, Best: 1/10, Worst: 7/10 Pain location: lateral L hip  Pain Quality: intermittent, aching, and tightness  Radiating: No  Numbness/Tingling: No Focal Weakness: No Aggravating factors: when his achilles tendinitis flares his hip pain flares, not wearing comfy shoes, standing on hard surfaces for long periods of time, standing on uneven surfaces; Relieving factors: Celebrex , rest 24-hour pain behavior: depends on the activity of the day, usually tighter in the morning How long can you sit: 5 hours How long can you stand: 20-30 mins History of prior back or hip injury, pain, surgery, or therapy: Yes; PT for L lumbar radiculopathy  Spring 2023 Dominant hand: right Imaging: No  Red flags: Negative for bowel/bladder changes, saddle paresthesia, testicular pain, h/o kidney stones, h/o spinal tumors, h/o compression fx, personal history of cancer/malignancy (especially prostate or pelvic), acute hip trauma, night pain, h/o abdominal aneurysm, abdominal pain, chills/fever, night sweats, nausea, vomiting, diarrhea, unexplained weight gain/loss;  PRECAUTIONS: None  WEIGHT BEARING RESTRICTIONS: No  FALLS: Has patient fallen in last 6 months? 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  Recent memory is intact.  Remote memory is intact.  Attention span and concentration are intact.  Expressive speech is intact.  Patient's fund of knowledge is within normal limits for educational level.    Gross Musculoskeletal Assessment Tremor: None; Bulk: Normal, no muscle wasting noted; Tone: Normal;  GAIT: Distance walked: 100 ft Assistive device utilized: None Level of assistance: Complete Independence Comments: wide BOS with bilat hip ER (toe-out)  Posture: Mild kyphotic sitting posture Bilat hip  ER in standing (toe-out) Widened BOS  AROM AROM (Normal range in degrees) AROM   Hip Right Left  Flexion (125) 93 93  Extension (15)    Abduction (40) 32 40  Adduction (30) 26 30  Internal Rotation (45) 19 18  External Rotation (45) 41 37      Knee    Flexion (135) WNL WNL  Extension (0) WNL WNL  (* = pain; Blank rows = not tested)  LE MMT: MMT (out of 5) Right  Left   Hip flexion 4/5 4/5*  Hip extension 4+/5 4+/5  Hip abduction 4/5 4/5  Hip adduction 4/5 4/5  Hip internal rotation    Hip external rotation (seated) 4/5 4/5  Knee flexion 4+/5 4+/5  Knee extension 5/5 5/5  Ankle dorsiflexion 5/5 5/5  Ankle plantarflexion Active Active  Ankle inversion    Ankle eversion    (* = pain; Blank rows = not tested)  Sensation Grossly intact to light touch throughout bilateral LEs as determined by testing dermatomes L2-S2. Proprioception, stereognosis, and hot/cold testing deferred on this date.  Reflexes Not tested  Muscle Length Not tested  Palpation Location Right Left         Lumbar paraspinals 0 0  Quadratus Lumborum 0 0  Iliac Crest 0 0  ASIS    Pubic Rami    Pubic symphysis   Femoral Triangle    Inguinal ligament    Gluteus Maximus    Gluteus Medius  1  Deep hip external rotators    Sacrum   PSIS    Fortin's Area (SIJ)    Coccyx   Ischial Tuberosity    Greater Trochanter  1  (Blank rows = not tested) Graded on 0-4 scale (0 = no pain, 1 = pain, 2 = pain with wincing/grimacing/flinching, 3 = pain with withdrawal, 4 = unwilling to allow palpation)  Passive Accessory Intervertebral Motion Not Tested  Special Tests Lumbar Radiculopathy and Discogenic: Centralization and Peripheralization (SN 92, -LR 0.12): Not done Slump (SN 83, -LR 0.32): R: Negative L: Negative SLR (SN 92, -LR 0.29): R: Not done L:  Not done  Hip: FABER (SN 81): R: Negative L: Negative FADIR (SN 94): R: Negative L: Negative Hip scour (SN 50): R: Negative L:  Negative   Physical Performance Measures Deferred this session    TODAY'S TREATMENT: 01/11/24   SUBJECTIVE: Pt reports that he is doing alright today. Achilles tendon has improved. He states that he walked a lot while in [place] without any hip pain. No resting hip pain upon arrival today and no specific questions.   PAIN: Denies   OBJECTIVE:   Ther-ex  NuStep L1-4 x 5 minutes for hip ROM and BLE strengthening during interval history;  Updated outcome measures with patient: Worst pain: HAGOS:  LE MMT: MMT (out of 5) Right  Left   Hip flexion 4/5 4/5*  Hip  extension 4+/5 4+/5  Hip abduction 4/5 4/5  Hip adduction 4/5 4/5  Hip internal rotation    Hip external rotation  4/5 4/5  (* = pain; Blank rows = not tested)  Hooklying bridge marches 2 x 5; R sidelying L manually resisted clams 2 x 10; R sidelying manually resisted L hip reverse clams 2 x 10;  R sidelying L manually resisted abduction x 10, DC second set due to pain;   Manual Therapy  L hip FABER, FADIR, and single knee to chest stretches 2 x 45s each; R sidelying L lateral hip and lateral thigh STM with theraband roller;   Therapeutic Activity  Forward/backward monster walks with blue tband around ankles 30' x 4; Lateral resisted walks with blue tband around ankles 30' x 4;   Not performed: Lateral 6 step downs to Airex pad 2 x 10 BLE; Forward resisted gait with Nautilus 125# x 5; Backwards resisted gait with Nautilus 125# x 2;  L hip belt assisted mobilizations, grade III-IV, long axis distraction, inferior at 90 flexion, and inferior in FABER position, 30s/bout x 3 bouts each;   PATIENT EDUCATION:  Education details: Pt educated throughout session about proper posture and technique with exercises. Improved exercise technique, movement at target joints, use of target muscles after min to mod verbal, visual, tactile cues.  Person educated: Patient Education method: Explanation and  demonstration Education comprehension: verbalized understanding and returned demonstration  HOME EXERCISE PROGRAM:  Access Code: [email or web address] URL: [email or web address] Date: 11/23/2023 Prepared by: Selinda Eck  Exercises - Hooklying Isometric Hip Abduction with Belt  - 2 x daily - 7 x weekly - 1 sets - 10 reps - 10s hold - Hooklying Isometric Hip Flexion  - 2 x daily - 7 x weekly - 1 sets - 10 reps - 10s hold - Supine 90/90 Isometric Hip Extension  - 2 x daily - 7 x weekly - 1 sets - 10 reps - 10s hold - Forward Monster Walks  - 1 x daily - 7 x weekly - Backward Monster Walks  - 1 x daily - 7 x weekly - Side Stepping with Resistance at Ankles  - 1 x daily - 7 x weekly - Mini Squat with Counter Support  - 2 x daily - 7 x weekly - 2 sets - 10 reps - Standing 3-Way Leg Reach with Resistance at Ankles and Counter Support  - 2 x daily - 7 x weekly - 2 sets - 10 reps   ASSESSMENT:  CLINICAL IMPRESSION: Updated outcome measures with patient during visit today.   Reintroduced NuStep today for ROM and strengthening without any increase in pain. Progressed mat table exercises as well as standing functional strengthening. Pt struggles with bridge marches. He reports pain today with resisted sidelying L hip abduction so discontinued. Plan to progress strengthening at future sessions. He will be ready for discharge over the next couple visits once he is able to demonstrate full strengthening without pain. He will also need updated outcome measures/goals at next visit. Pt encouraged to follow-up as scheduled. He will benefit from PT services to address deficits in strength, ROM, and general mobility in order to return to his PLOF and be able to perform care-taking tasks once his son is born.     OBJECTIVE IMPAIRMENTS: Abnormal gait, decreased activity tolerance, decreased mobility, difficulty walking, decreased ROM, decreased strength, obesity, and pain.   ACTIVITY LIMITATIONS: carrying,  lifting, bending, standing, squatting, and locomotion level  PARTICIPATION LIMITATIONS: cleaning, driving, shopping, community  activity, and occupation  PERSONAL FACTORS: 3+ comorbidities: HTN, DMT2, OSA, Dyslipidemia, and Obesity are also affecting patient's functional outcome.   REHAB POTENTIAL: Good  CLINICAL DECISION MAKING: Stable/uncomplicated  EVALUATION COMPLEXITY: Low   GOALS: Goals reviewed with patient? Yes  SHORT TERM GOALS: Target date: 11/30/2023  Pt will be independent with HEP in order to improve strength and decrease hip pain to improve pain-free function at home and work. Baseline: 11/23/23: HEP given  Goal status: INITIAL   LONG TERM GOALS: Target date: 12/28/2023   1.  Pt will decrease worst hip pain by at least 3 points on the NPRS in order to demonstrate clinically significant reduction in hip pain. Baseline: 11/16/23: 7/10 Goal status: INITIAL  3.  Pt will decrease HAGOS score by at least 20 points in order demonstrate clinically significant reduction in hip pain/disability.       Baseline: 11/16/23: 60.6% Goal status: INITIAL  4. Pt will increase hip flexion, hip ABD, and hip ER strength by at least 1/2 MMT grade         in order to demonstrate improvement in strength and function.     Baseline: 11/16/23: 4/5     Goal status: INITIAL   PLAN: PT FREQUENCY: 1-2x/week  PT DURATION: 6 weeks  PLANNED INTERVENTIONS: Therapeutic exercises, Therapeutic activity, Neuromuscular re-education, Balance training, Gait training, Patient/Family education, Self Care, Joint mobilization, Joint manipulation, Vestibular training, Canalith repositioning, Orthotic/Fit training, DME instructions, Dry Needling, Electrical stimulation, Spinal manipulation, Spinal mobilization, Cryotherapy, Moist heat, Taping, Traction, Ultrasound, Ionotophoresis 4mg /ml Dexamethasone, Manual therapy, and Re-evaluation.  PLAN FOR NEXT SESSION: Update outcome measures/goals, stairs, continue  isometrics, check HEP progress, work Office manager D Guneet Delpino PT, DPT, [employer]  Havoc Sanluis, PT 01/08/2024, 10:26 AM

## 2024-01-11 ENCOUNTER — Ambulatory Visit: Attending: Family Medicine

## 2024-01-11 DIAGNOSIS — M25652 Stiffness of left hip, not elsewhere classified: Secondary | ICD-10-CM | POA: Insufficient documentation

## 2024-01-11 DIAGNOSIS — M25552 Pain in left hip: Secondary | ICD-10-CM | POA: Insufficient documentation

## 2024-01-11 DIAGNOSIS — M6281 Muscle weakness (generalized): Secondary | ICD-10-CM | POA: Insufficient documentation

## 2024-01-12 ENCOUNTER — Ambulatory Visit (INDEPENDENT_AMBULATORY_CARE_PROVIDER_SITE_OTHER): Admitting: Family Medicine

## 2024-01-12 ENCOUNTER — Encounter: Payer: Self-pay | Admitting: Family Medicine

## 2024-01-12 VITALS — BP 138/90 | HR 75 | Ht 73.0 in | Wt 317.2 lb

## 2024-01-12 DIAGNOSIS — E118 Type 2 diabetes mellitus with unspecified complications: Secondary | ICD-10-CM

## 2024-01-12 DIAGNOSIS — Z7985 Long-term (current) use of injectable non-insulin antidiabetic drugs: Secondary | ICD-10-CM | POA: Diagnosis not present

## 2024-01-12 DIAGNOSIS — M19071 Primary osteoarthritis, right ankle and foot: Secondary | ICD-10-CM | POA: Diagnosis not present

## 2024-01-12 DIAGNOSIS — I10 Essential (primary) hypertension: Secondary | ICD-10-CM | POA: Diagnosis not present

## 2024-01-12 LAB — POCT GLYCOSYLATED HEMOGLOBIN (HGB A1C): Hemoglobin A1C: 5.8 % — AB (ref 4.0–5.6)

## 2024-01-12 MED ORDER — TIRZEPATIDE 10 MG/0.5ML ~~LOC~~ SOAJ: 10.0000 mg | SUBCUTANEOUS | 1 refills | Status: AC

## 2024-01-12 NOTE — Progress Notes (Signed)
 Primary Care / Sports Medicine Office Visit  Patient Information:  Patient ID: Travis Petty, male DOB: Feb 20, 1976 Age: 48 y.o. MRN: [id number]   Travis Petty is a pleasant 47 y.o. male presenting with the following:  Chief Complaint  Patient presents with   Medical Management of Chronic Issues    Patient presents today for HTN and DM. He has been taking medications as directed.     Vitals:   01/12/24 0932  BP: (!) 138/90  Pulse: 75  SpO2: 97%   Vitals:   01/12/24 0932  Weight: (!) 317 lb 3.2 oz (143.9 kg)  Height: 6' 1 (1.854 m)   Body mass index is 41.85 kg/m.  No results found.   Discussed the use of AI scribe software for clinical note transcription with the patient, who gave verbal consent to proceed.   Independent interpretation of notes and tests performed by another provider:   None  Procedures performed:   None  Pertinent History, Exam, Impression, and Recommendations:   Problem List Items Addressed This Visit     Arthritis of first metatarsophalangeal (MTP) joint of right foot   Right foot and ankle pain and swelling - Flare-up of gout in the right foot for approximately one week, beginning while in [place]  - Initial pain localized to the big toe ('bronch spot'), a common site for previous gout flares - Pain has since spread to the right ankle, which is now swollen - Tenderness in the big toe area - Pain with pressure on the top of the foot and back of the ankle - No pain in other areas of the foot and ankle - Pain is improved today compared to previous days - Swelling present in the right ankle  Functional impact - Impaired mobility due to foot pain - Cancelled plans and considered postponing moving activities - Using a boot for pain relief, especially for padding when stepping down  Physical Exam VITALS: BP- 138/90 MEASUREMENTS: Weight- 317. INSPECTION: Right foot first metatarsophalangeal joint swollen and tender, no erythema.  Right foot Achilles tendinitis. Right foot anterior tibialis muscle inflamed.  Right foot first MTP arthritis flare and suspected gout flare Acute flare with suspected gout. Uric acid levels pending for confirmation. - Order uric acid level. - Continue meloxicam . Switch to Celebrex  if no improvement by Friday. - Consider indomethacin if uric acid indicates gout. - Discuss allopurinol for long-term management if uric acid elevated. - Consider podiatry referral for orthotics if gout not confirmed.  Right foot anterior tibialis and Achilles tendinitis Inflammation due to altered gait mechanics from foot pain. - Use boot for support. - Perform stretching exercises post-boot use.      Long-term (current) use of injectable non-insulin antidiabetic drugs   Relevant Medications   tirzepatide  (MOUNJARO ) 10 MG/0.5ML Pen   Primary hypertension   Hypertension Blood pressure elevated, likely secondary to pain and inflammation. - Monitor blood pressure, especially if pain persists or anti-inflammatory use continues.      Type 2 diabetes mellitus with complication (HCC) - Primary   Diabetes management - Not taking metformin  due to low blood sugar levels - Currently using Mounjaro  7.5 mg for diabetes management  Type 2 diabetes mellitus, controlled Well-controlled with A1c of 5.8. Consideration for Mounjaro  dose increase. - Increase Mounjaro  to 10 mg after current supply. - Discontinue metformin .      Relevant Medications   tirzepatide  (MOUNJARO ) 10 MG/0.5ML Pen   Other Relevant Orders   POCT glycosylated hemoglobin (Hb A1C) (  Completed)     Orders & Medications Medications:  Meds ordered this encounter  Medications   tirzepatide  (MOUNJARO ) 10 MG/0.5ML Pen    Sig: Inject 10 mg into the skin once a week.    Dispense:  6 mL    Refill:  1   Orders Placed This Encounter  Procedures   POCT glycosylated hemoglobin (Hb A1C)     No follow-ups on file.     Selinda JINNY Ku, MD,  [facility]   Primary Care Sports Medicine Primary Care and Sports Medicine at [facility]

## 2024-01-12 NOTE — Assessment & Plan Note (Addendum)
 Diabetes management - Not taking metformin  due to low blood sugar levels - Currently using Mounjaro  7.5 mg for diabetes management  Type 2 diabetes mellitus, controlled Well-controlled with A1c of 5.8. Consideration for Mounjaro  dose increase. - Increase Mounjaro  to 10 mg weekly. - Discontinue metformin .

## 2024-01-12 NOTE — Assessment & Plan Note (Signed)
 Right foot and ankle pain and swelling - Flare-up of gout in the right foot for approximately one week, beginning while in [place]  - Initial pain localized to the big toe ('bronch spot'), a common site for previous gout flares - Pain has since spread to the right ankle, which is now swollen - Tenderness in the big toe area - Pain with pressure on the top of the foot and back of the ankle - No pain in other areas of the foot and ankle - Pain is improved today compared to previous days - Swelling present in the right ankle  Functional impact - Impaired mobility due to foot pain - Cancelled plans and considered postponing moving activities - Using a boot for pain relief, especially for padding when stepping down  Physical Exam VITALS: BP- 138/90 MEASUREMENTS: Weight- 317. INSPECTION: Right foot first metatarsophalangeal joint swollen and tender, no erythema. Right foot Achilles tendinitis. Right foot anterior tibialis muscle inflamed.  Right foot first MTP arthritis flare and suspected gout flare Acute flare with suspected gout. Uric acid levels pending for confirmation. - Order uric acid level. - Continue meloxicam . Switch to Celebrex  if no improvement by Friday. - Consider indomethacin if uric acid indicates gout. - Discuss allopurinol for long-term management if uric acid elevated. - Consider podiatry referral for orthotics if gout not confirmed.  Right foot anterior tibialis and Achilles tendinitis Inflammation due to altered gait mechanics from foot pain. - Use boot for support. - Perform stretching exercises post-boot use.

## 2024-01-12 NOTE — Assessment & Plan Note (Signed)
 Hypertension Blood pressure elevated, likely secondary to pain and inflammation. - Monitor blood pressure, especially if pain persists or anti-inflammatory use continues.

## 2024-01-12 NOTE — Patient Instructions (Signed)
 Right foot arthritis flare and suspected gout  - Continue taking meloxicam  as directed. - If pain does not improve by Friday, switch to Celebrex  as instructed. - Uric acid blood test has been ordered; further treatment may be adjusted based on results. - If uric acid is high, indomethacin may be considered. - If gout is confirmed and flares continue, discuss starting allopurinol for long-term management. - If gout is not confirmed, consider seeing a podiatrist for possible orthotics. - Use the boot for support and padding when walking. - Limit activities that make pain or swelling worse.  Right foot anterior tibialis and Achilles tendinitis  - Continue using the boot for support as needed. - Begin gentle stretching exercises for your foot and ankle after you stop using the boot.  Blood pressure (hypertension)  - Monitor your blood pressure at home, especially if pain continues or you keep using anti-inflammatory medications. - Let the office know if your blood pressure stays high.  Type 2 diabetes  - Increase your Mounjaro  dose to 10 mg after finishing your current supply. - Stop taking metformin  as instructed. - Your blood sugar is well controlled; keep monitoring as usual.  Red flags - Seek care right away if you have:  - Sudden or severe increase in foot or ankle pain - Redness, warmth, or spreading swelling in your foot or ankle - Fever or chills - Trouble moving your foot or ankle - Signs of very high blood sugar (increased thirst, frequent urination, confusion) - Signs of very low blood sugar (shakiness, sweating, confusion, fainting)

## 2024-01-13 ENCOUNTER — Ambulatory Visit: Payer: Self-pay | Admitting: Family Medicine

## 2024-01-13 LAB — COMPREHENSIVE METABOLIC PANEL WITH GFR
ALT: 20 IU/L (ref 0–44)
AST: 20 IU/L (ref 0–40)
Albumin: 4.1 g/dL (ref 4.1–5.1)
Alkaline Phosphatase: 64 IU/L (ref 44–121)
BUN/Creatinine Ratio: 10 (ref 9–20)
BUN: 12 mg/dL (ref 6–24)
Bilirubin Total: 1.3 mg/dL — ABNORMAL HIGH (ref 0.0–1.2)
CO2: 27 mmol/L (ref 20–29)
Calcium: 9.1 mg/dL (ref 8.7–10.2)
Chloride: 98 mmol/L (ref 96–106)
Creatinine, Ser: 1.16 mg/dL (ref 0.76–1.27)
Globulin, Total: 2.9 g/dL (ref 1.5–4.5)
Glucose: 96 mg/dL (ref 70–99)
Potassium: 3.5 mmol/L (ref 3.5–5.2)
Sodium: 139 mmol/L (ref 134–144)
Total Protein: 7 g/dL (ref 6.0–8.5)
eGFR: 78 mL/min/1.73 (ref >59)

## 2024-01-13 LAB — LIPID PANEL
Chol/HDL Ratio: 4.2 ratio (ref 0.0–5.0)
Cholesterol, Total: 177 mg/dL (ref 100–199)
HDL: 42 mg/dL (ref >39)
LDL Chol Calc (NIH): 117 mg/dL — ABNORMAL HIGH (ref 0–99)
Triglycerides: 99 mg/dL (ref 0–149)
VLDL Cholesterol Cal: 18 mg/dL (ref 5–40)

## 2024-01-13 LAB — URIC ACID: Uric Acid: 8.3 mg/dL (ref 3.8–8.4)

## 2024-01-13 LAB — CBC
Hematocrit: 44.7 % (ref 37.5–51.0)
Hemoglobin: 15.1 g/dL (ref 13.0–17.7)
MCH: 28.7 pg (ref 26.6–33.0)
MCHC: 33.8 g/dL (ref 31.5–35.7)
MCV: 85 fL (ref 79–97)
Platelets: 263 x10E3/uL (ref 150–450)
RBC: 5.26 x10E6/uL (ref 4.14–5.80)
RDW: 13.3 % (ref 11.6–15.4)
WBC: 5.3 x10E3/uL (ref 3.4–10.8)

## 2024-01-13 LAB — VITAMIN D 25 HYDROXY (VIT D DEFICIENCY, FRACTURES): Vit D, 25-Hydroxy: 23.4 ng/mL — ABNORMAL LOW (ref 30.0–100.0)

## 2024-01-13 MED ORDER — VITAMIN D (ERGOCALCIFEROL) 1.25 MG (50000 UNIT) PO CAPS: 50000.0000 [IU] | ORAL_CAPSULE | ORAL | 0 refills | Status: AC

## 2024-01-13 NOTE — Telephone Encounter (Signed)
 Please review.  KP

## 2024-01-22 ENCOUNTER — Other Ambulatory Visit: Payer: Self-pay

## 2024-01-22 ENCOUNTER — Encounter: Payer: Self-pay | Admitting: Family Medicine

## 2024-01-22 DIAGNOSIS — I10 Essential (primary) hypertension: Secondary | ICD-10-CM

## 2024-01-22 MED ORDER — METOPROLOL SUCCINATE ER 25 MG PO TB24: 25.0000 mg | ORAL_TABLET | Freq: Every day | ORAL | 0 refills | Status: DC

## 2024-01-27 ENCOUNTER — Ambulatory Visit (INDEPENDENT_AMBULATORY_CARE_PROVIDER_SITE_OTHER): Admitting: Family Medicine

## 2024-01-27 ENCOUNTER — Encounter: Payer: Self-pay | Admitting: Family Medicine

## 2024-01-27 VITALS — BP 130/84 | HR 73 | Ht 73.0 in | Wt 318.8 lb

## 2024-01-27 DIAGNOSIS — M79675 Pain in left toe(s): Secondary | ICD-10-CM | POA: Diagnosis not present

## 2024-01-27 MED ORDER — INDOMETHACIN 50 MG PO CAPS: 50.0000 mg | ORAL_CAPSULE | Freq: Three times a day (TID) | ORAL | 0 refills | Status: AC

## 2024-01-27 NOTE — Patient Instructions (Signed)
 VISIT SUMMARY:  Today, you were seen for pain and swelling in your left big toe due to an acute gout flare. We discussed your current symptoms, reviewed your medications, and made some changes to your treatment plan to help manage your condition.  YOUR PLAN:  ACUTE GOUT FLARE OF THE LEFT GREAT TOE INTERPHALANGEAL JOINT: You have an acute gout flare in your left big toe, confirmed by imaging. Your recent dietary intake may have contributed to this flare-up. -Stop taking meloxicam . -Start taking indomethacin  50 mg three times a day with food for 24-48 hours during the flare. -Take two doses of indomethacin  today with breakfast and lunch, and another dose with dinner. -Skip the midday dose of indomethacin  tomorrow due to your scheduled injection. -Schedule an injection for tomorrow if symptoms persist. -Wear a post-op shoe to minimize toe movement and reduce pain. -Apply ice to reduce swelling. -Rest and avoid excessive movement.

## 2024-01-27 NOTE — Assessment & Plan Note (Signed)
 History of Present Illness Travis Petty is a 48 year old male with gout who presents with left big toe pain and swelling.  Left first IP joint pain and swelling - Acute onset of significant pain and swelling localized to the left big toe - Toe is extremely swollen and hot to the touch - Pain is persistent and impacts ability to move comfortably - No radiation of pain or swelling to other areas - No other areas of swelling or pain  Gout management and medication response - History of gout - Currently taking meloxicam  15 mg daily - Using over-the-counter gout cream - Persistent symptoms despite current treatment regimen - Recent dietary intake of cornmeal hash, suspected as a possible trigger for flare-up  Functional impact and activity modification - In process of moving, has hired movers due to current condition - Plans to minimize physical activity to avoid exacerbating symptoms  Physical Exam INSPECTION: Left great toe IP joint extremely swollen. PALPATION: Marked tenderness and warmth at left first IP  Assessment and Plan Acute gout flare of the left great toe interphalangeal joint Acute gout flare confirmed by imaging, no significant arthritis. Dietary intake may have contributed. - Discontinue meloxicam . - Prescribe indomethacin  50 mg TID with food for 24-48 hours during flare. - Take two doses of indomethacin  today due to prior meloxicam  intake. - Take indomethacin  with breakfast and lunch today, and with dinner. - Skip midday dose of indomethacin  tomorrow due to scheduled injection. - Schedule injection for tomorrow if symptoms persist. - Recommend post-op shoe to minimize toe movement and reduce pain. - Apply ice to reduce swelling. - Rest and avoid excessive movement.  Osteoarthritis of the left foot (mild) Mild osteoarthritis with minimal imaging findings, overshadowed by acute gout flare. - Monitor for symptom changes or worsening arthritis.

## 2024-01-27 NOTE — Progress Notes (Signed)
     Primary Care / Sports Medicine Office Visit  Patient Information:  Patient ID: Travis Petty, male DOB: Mar 22, 1976 Age: 48 y.o. MRN: [id number]   Travis Petty is a pleasant 48 y.o. male presenting with the following:  Chief Complaint  Patient presents with   Foot Pain    Vitals:   01/27/24 1031  BP: 130/84  Pulse: 73  SpO2: 98%   Vitals:   01/27/24 1031  Weight: (!) 318 lb 12.8 oz (144.6 kg)  Height: 6' 1 (1.854 m)   Body mass index is 42.06 kg/m.  No results found.   Discussed the use of AI scribe software for clinical note transcription with the patient, who gave verbal consent to proceed.   Independent interpretation of notes and tests performed by another provider:   None  Procedures performed:   None  Pertinent History, Exam, Impression, and Recommendations:   Problem List Items Addressed This Visit     Great toe pain, left - Primary   History of Present Illness Travis Petty is a 48 year old male with gout who presents with left big toe pain and swelling.  Left first IP joint pain and swelling - Acute onset of significant pain and swelling localized to the left big toe - Toe is extremely swollen and hot to the touch - Pain is persistent and impacts ability to move comfortably - No radiation of pain or swelling to other areas - No other areas of swelling or pain  Gout management and medication response - History of gout - Currently taking meloxicam  15 mg daily - Using over-the-counter gout cream - Persistent symptoms despite current treatment regimen - Recent dietary intake of cornmeal hash, suspected as a possible trigger for flare-up  Functional impact and activity modification - In process of moving, has hired movers due to current condition - Plans to minimize physical activity to avoid exacerbating symptoms  Physical Exam INSPECTION: Left great toe IP joint extremely swollen. PALPATION: Marked tenderness and  warmth at left first IP  Assessment and Plan Acute gout flare of the left great toe interphalangeal joint Acute gout flare confirmed by imaging, no significant arthritis. Dietary intake may have contributed. - Discontinue meloxicam . - Prescribe indomethacin  50 mg TID with food for 24-48 hours during flare. - Take two doses of indomethacin  today due to prior meloxicam  intake. - Take indomethacin  with breakfast and lunch today, and with dinner. - Skip midday dose of indomethacin  tomorrow due to scheduled injection. - Schedule injection for tomorrow if symptoms persist. - Recommend post-op shoe to minimize toe movement and reduce pain. - Apply ice to reduce swelling. - Rest and avoid excessive movement.  Osteoarthritis of the left foot (mild) Mild osteoarthritis with minimal imaging findings, overshadowed by acute gout flare. - Monitor for symptom changes or worsening arthritis.      Relevant Medications   indomethacin  (INDOCIN ) 50 MG capsule     Orders & Medications Medications:  Meds ordered this encounter  Medications   indomethacin  (INDOCIN ) 50 MG capsule    Sig: Take 1 capsule (50 mg total) by mouth 3 (three) times daily with meals.    Dispense:  60 capsule    Refill:  0   No orders of the defined types were placed in this encounter.    No follow-ups on file.     Selinda JINNY Ku, MD, [facility]   Primary Care Sports Medicine Primary Care and Sports Medicine at [facility]

## 2024-01-28 ENCOUNTER — Encounter: Payer: Self-pay | Admitting: Family Medicine

## 2024-01-28 ENCOUNTER — Other Ambulatory Visit (INDEPENDENT_AMBULATORY_CARE_PROVIDER_SITE_OTHER): Payer: Self-pay | Admitting: Radiology

## 2024-01-28 ENCOUNTER — Ambulatory Visit (INDEPENDENT_AMBULATORY_CARE_PROVIDER_SITE_OTHER): Admitting: Family Medicine

## 2024-01-28 VITALS — BP 130/84 | HR 63 | Ht 73.0 in | Wt 318.0 lb

## 2024-01-28 DIAGNOSIS — M79675 Pain in left toe(s): Secondary | ICD-10-CM

## 2024-01-28 MED ORDER — TRIAMCINOLONE ACETONIDE 40 MG/ML IJ SUSP
10.0000 mg | Freq: Once | INTRAMUSCULAR | Status: AC
  Administered 2024-01-28: 10 mg via INTRAMUSCULAR

## 2024-01-28 NOTE — Progress Notes (Signed)
 Primary Care / Sports Medicine Office Visit  Patient Information:  Patient ID: Travis Petty, male DOB: [birth year] Age: 48 y.o. MRN: [id number]   Travis Petty is a pleasant 48 y.o. male presenting with the following:  Chief Complaint  Patient presents with   Toe Pain    Left big toe pain and swelling x 2 days. Patient has some relief with indocin .     Vitals:   01/28/24 0906  BP: 130/84  Pulse: 63  SpO2: 99%   Vitals:   01/28/24 0906  Weight: (!) 318 lb (144.2 kg)  Height: 6' 1 (1.854 m)   Body mass index is 41.96 kg/m.  No results found.   Discussed the use of AI scribe software for clinical note transcription with the patient, who gave verbal consent to proceed.   Independent interpretation of notes and tests performed by another provider:   None  Procedures performed:   Procedure:  Injection of left great toe under ultrasound guidance. Ultrasound guidance utilized for out of plane approach to the left interphalangeal joint of the great toe, cortical roughening noted, dynamic injectate intra-articular flow noted Samsung HS60 device utilized with permanent recording / reporting. Verbal informed consent obtained and verified. Skin prepped in a sterile fashion. Ethyl chloride for topical local analgesia.  Completed without difficulty and tolerated well. Medication: triamcinolone  acetonide 40 mg/mL suspension for injection 0.25 mL intra-articular total and 10 mL lidocaine 1% without epinephrine utilized for extra-articular needle placement anesthetic Advised to contact for fevers/chills, erythema, induration, drainage, or persistent bleeding.   Pertinent History, Exam, Impression, and Recommendations:   Problem List Items Addressed This Visit     Great toe pain, left - Primary   History of Present Illness Travis Petty is a 48 year old male who presents with left big toe pain and swelling.  Left hallux pain and swelling - Onset of  pain and swelling in the left big toe - Area is tender and was initially unable to be touched due to pain - Improvement in swelling after two doses of indomethacin  last night with food - Currently able to bend the toe - Able to ambulate with the aid of a special shoe, though it is cumbersome, especially on stairs  Functional limitations and concerns - Special shoe required for ambulation, causing difficulty particularly with stairs - Travel planned for tomorrow, concerned about increased tendon movement and potential exacerbation of symptoms - Concerned about the possibility of pain flaring up again, especially as he needs to assist his wife who is unwell - Focused on maintaining control of swelling and preserving mobility without significant discomfort  Medication use and response - Took two doses of indomethacin  last night with food, resulting in some improvement in swelling - Plans to increase to three doses of indomethacin  today, taking with meals  Physical Exam PALPATION: Swelling subtly improved at left great toe over interval examination. Tenderness present at medial aspect left great toe at IP.  Assessment and Plan Acute gout flare of the left first IP joint Acute gout flare with tenderness and swelling. Indomethacin  provided partial relief. Injection chosen for definitive relief due to travel needs. - Administer injection with anesthetic to the left first IP joint. - Continue indomethacin  TID until symptom resolution. - Use acetaminophen  for breakthrough pain PRN. - Apply ice to affected area. - Continue wearing boot until symptoms resolve. - Remove gauze between toes upon returning home. - Discontinue medication and boot once symptoms fully resolve.  Relevant Orders   US  LIMITED JOINT SPACE STRUCTURES LOW LEFT     Orders & Medications Medications:  Meds ordered this encounter  Medications   triamcinolone  acetonide (KENALOG -40) injection 10 mg   Orders Placed  This Encounter  Procedures   US  LIMITED JOINT SPACE STRUCTURES LOW LEFT     No follow-ups on file.     Selinda JINNY Ku, MD, [facility]   Primary Care Sports Medicine Primary Care and Sports Medicine at [facility]

## 2024-01-28 NOTE — Patient Instructions (Signed)
 VISIT SUMMARY:  Today, you were seen for pain and swelling in your left big toe, which has been diagnosed as an acute gout flare. You have been given an injection for relief and provided with a plan to manage your symptoms, especially considering your upcoming travel.  YOUR PLAN:  ACUTE GOUT FLARE OF THE LEFT FIRST IP JOINT: You have an acute gout flare in your left big toe, causing pain and swelling. -You received an injection with anesthetic to your left big toe joint for relief. -Continue taking indomethacin  three times a day with meals until your symptoms resolve.  Take this medication with food. -Use acetaminophen  as needed for any breakthrough pain. -Apply ice to the affected area to help reduce swelling. -Continue wearing the special shoe until your symptoms resolve. -Remove the gauze between your toes when you get home. -You can stop taking the medication and wearing the boot once your symptoms fully resolve.

## 2024-01-28 NOTE — Assessment & Plan Note (Signed)
 History of Present Illness Travis Petty is a 48 year old male who presents with left big toe pain and swelling.  Left hallux pain and swelling - Onset of pain and swelling in the left big toe - Area is tender and was initially unable to be touched due to pain - Improvement in swelling after two doses of indomethacin  last night with food - Currently able to bend the toe - Able to ambulate with the aid of a special shoe, though it is cumbersome, especially on stairs  Functional limitations and concerns - Special shoe required for ambulation, causing difficulty particularly with stairs - Travel planned for tomorrow, concerned about increased tendon movement and potential exacerbation of symptoms - Concerned about the possibility of pain flaring up again, especially as he needs to assist his wife who is unwell - Focused on maintaining control of swelling and preserving mobility without significant discomfort  Medication use and response - Took two doses of indomethacin  last night with food, resulting in some improvement in swelling - Plans to increase to three doses of indomethacin  today, taking with meals  Physical Exam PALPATION: Swelling subtly improved at left great toe over interval examination. Tenderness present at medial aspect left great toe at IP.  Assessment and Plan Acute gout flare of the left first IP joint Acute gout flare with tenderness and swelling. Indomethacin  provided partial relief. Injection chosen for definitive relief due to travel needs. - Administer injection with anesthetic to the left first IP joint. - Continue indomethacin  TID until symptom resolution. - Use acetaminophen  for breakthrough pain PRN. - Apply ice to affected area. - Continue wearing boot until symptoms resolve. - Remove gauze between toes upon returning home. - Discontinue medication and boot once symptoms fully resolve.

## 2024-02-15 ENCOUNTER — Other Ambulatory Visit: Payer: Self-pay | Admitting: Family Medicine

## 2024-02-15 DIAGNOSIS — I10 Essential (primary) hypertension: Secondary | ICD-10-CM

## 2024-02-16 NOTE — Telephone Encounter (Signed)
 Requested Prescriptions  Pending Prescriptions Disp Refills   metoprolol  succinate (TOPROL -XL) 25 MG 24 hr tablet [Pharmacy Med Name: METOPROLOL  SUCC ER 25 MG TAB] 90 tablet 1    Sig: TAKE 1 TABLET (25 MG TOTAL) BY MOUTH DAILY.     Cardiovascular:  Beta Blockers Passed - 02/16/2024  4:17 PM      Passed - Last BP in normal range    BP Readings from Last 1 Encounters:  01/28/24 130/84         Passed - Last Heart Rate in normal range    Pulse Readings from Last 1 Encounters:  01/28/24 63         Passed - Valid encounter within last 6 months    Recent Outpatient Visits           2 weeks ago Great toe pain, left   [place] [facility], Selinda PARAS, MD   2 weeks ago Great toe pain, left   [facility] Primary Care & [facility] Medicine at [facility], Selinda PARAS, MD   1 month ago Type 2 diabetes mellitus with complication [facility])   [place] Primary Care & Sports Medicine at [facility], Selinda PARAS, MD   2 months ago Greater trochanteric pain syndrome of left lower extremity   [place] [facility] Care & Sports Medicine at [facility], Selinda PARAS, MD   5 months ago Healthcare maintenance   [facility] Primary Care & Sports Medicine at [facility], Selinda PARAS, MD

## 2024-02-22 ENCOUNTER — Other Ambulatory Visit: Payer: Self-pay | Admitting: Family Medicine

## 2024-02-23 ENCOUNTER — Other Ambulatory Visit: Payer: Self-pay | Admitting: Family Medicine

## 2024-02-23 NOTE — Telephone Encounter (Signed)
 D/C 01/27/24. Requested Prescriptions  Refused Prescriptions Disp Refills   celecoxib  (CELEBREX ) 200 MG capsule [Pharmacy Med Name: Celecoxib  200 MG Oral Capsule] 180 capsule 3    Sig: TAKE 1 CAPSULE BY MOUTH TWICE  DAILY AS NEEDED     Analgesics:  COX2 Inhibitors Failed - 02/23/2024  3:18 PM      Failed - Manual Review: Labs are only required if the patient has taken medication for more than 8 weeks.      Passed - HGB in normal range and within 360 days    Hemoglobin  Date Value Ref Range Status  01/12/2024 15.1 13.0 - 17.7 g/dL Final         Passed - Cr in normal range and within 360 days    Creatinine, Ser  Date Value Ref Range Status  01/12/2024 1.16 0.76 - 1.27 mg/dL Final         Passed - HCT in normal range and within 360 days    Hematocrit  Date Value Ref Range Status  01/12/2024 44.7 37.5 - 51.0 % Final         Passed - AST in normal range and within 360 days    AST  Date Value Ref Range Status  01/12/2024 20 0 - 40 IU/L Final         Passed - ALT in normal range and within 360 days    ALT  Date Value Ref Range Status  01/12/2024 20 0 - 44 IU/L Final         Passed - eGFR is 30 or above and within 360 days    eGFR  Date Value Ref Range Status  01/12/2024 78 >59 mL/min/1.73 Final         Passed - Patient is not pregnant      Passed - Valid encounter within last 12 months    Recent Outpatient Visits           3 weeks ago Great toe pain, left   [place] [facility], Selinda PARAS, MD   3 weeks ago Great toe pain, left   [place] [facility] at [facility], Selinda PARAS, MD   1 month ago Type 2 diabetes mellitus with complication [facility])   [place] [facility] Medicine at [facility], Selinda PARAS, MD   2 months ago Greater trochanteric pain syndrome of left lower extremity    Primary Care & Sports Medicine at [facility], Selinda PARAS, MD    5 months ago Healthcare maintenance   [facility] Primary Care & Sports Medicine at [facility], Selinda PARAS, MD

## 2024-02-25 NOTE — Telephone Encounter (Signed)
 Requested medication (s) are due for refill today: yes  Requested medication (s) are on the active medication list: yes  Last refill:  01/13/24  Future visit scheduled: yes  Notes to clinic:  Manual Review: Route requests for 50,000 IU strength to the provider      Requested Prescriptions  Pending Prescriptions Disp Refills   Vitamin D , Ergocalciferol , (DRISDOL ) 1.25 MG (50000 UNIT) CAPS capsule [Pharmacy Med Name: Vitamin D  (Ergocalciferol ) 1.25 MG (50000 UT) Oral Capsule] 8 capsule 5    Sig: TAKE 1 CAPSULE BY MOUTH EVERY 7  DAYS TAKE FOR 8 TOTAL DOSES     Endocrinology:  Vitamins - Vitamin D  Supplementation 2 Failed - 02/25/2024  8:31 AM      Failed - Manual Review: Route requests for 50,000 IU strength to the provider      Failed - Vitamin D  in normal range and within 360 days    Vit D, 25-Hydroxy  Date Value Ref Range Status  01/12/2024 23.4 (L) 30.0 - 100.0 ng/mL Final    Comment:    Vitamin D  deficiency has been defined by the Institute of Medicine and an Endocrine Society practice guideline as a level of serum 25-OH vitamin D  less than 20 ng/mL (1,2). The Endocrine Society went on to further define vitamin D  insufficiency as a level between 21 and 29 ng/mL (2). 1. IOM (Institute of Medicine). 2010. Dietary reference    intakes for calcium and D. [place]  [place]: The    [employer]. 2. Holick MF, Binkley Kosse, Bischoff-Ferrari HA, et al.    Evaluation, treatment, and prevention of vitamin D     deficiency: an Endocrine Society clinical practice    guideline. JCEM. 2011 Jul; 96(7):1911-30.          Passed - Ca in normal range and within 360 days    Calcium  Date Value Ref Range Status  01/12/2024 9.1 8.7 - 10.2 mg/dL Final         Passed - Valid encounter within last 12 months    Recent Outpatient Visits           4 weeks ago Great toe pain, left   [place] [facility], Selinda PARAS, MD   4 weeks ago Great  toe pain, left   [facility] Primary Care & [facility] Medicine at [facility], Selinda PARAS, MD   1 month ago Type 2 diabetes mellitus with complication [facility])   [place] [facility] Medicine at [facility], Selinda PARAS, MD   2 months ago Greater trochanteric pain syndrome of left lower extremity   [place] [facility] Care [facility] Medicine at [facility], Selinda PARAS, MD   5 months ago Healthcare maintenance   [facility] Primary Care & Sports Medicine at [facility], Selinda PARAS, MD

## 2024-04-14 ENCOUNTER — Encounter: Payer: Self-pay | Admitting: Family Medicine

## 2024-04-14 NOTE — Telephone Encounter (Signed)
 Please review and send medication if appropriate.  JM

## 2024-04-18 ENCOUNTER — Other Ambulatory Visit: Payer: Self-pay | Admitting: Family Medicine

## 2024-04-18 DIAGNOSIS — I10 Essential (primary) hypertension: Secondary | ICD-10-CM

## 2024-04-18 NOTE — Telephone Encounter (Signed)
 Copied from CRM #[id number]. Topic: General - Other >> Apr 18, 2024  9:51 AM Joesph NOVAK wrote: Reason for CRM: patient would like to be notified if prescription is going to be sent in.  losartan -hydrochlorothiazide (HYZAAR) 100-25 MG tablet metoprolol  succinate (TOPROL -XL) 25 MG 24 hr tablet

## 2024-04-18 NOTE — Telephone Encounter (Unsigned)
 Copied from CRM #[id number]. Topic: Clinical - Medication Refill >> Apr 18, 2024  9:45 AM Travis Petty wrote: Medication: losartan -hydrochlorothiazide (HYZAAR) 100-25 MG tablet [[id number]] metoprolol  succinate (TOPROL -XL) 25 MG 24 hr tablet [[id number]]  ---- A request has already been sent but pt is following up on this.  He needs his meds sent to a pharmacy in [facility].   Has the patient contacted their pharmacy? Yes (Agent: If no, request that the patient contact the pharmacy for the refill. If patient does not wish to contact the pharmacy document the reason why and proceed with request.) (Agent: If yes, when and what did the pharmacy advise?)  This is the patient's preferred pharmacy:  [facility] - [address] [place], [place] [address] [facility]: [phone]  Is this the correct pharmacy for this prescription? Yes If no, delete pharmacy and type the correct one.   Has the prescription been filled recently? Yes  Is the patient out of the medication? Yes  Has the patient been seen for an appointment in the last year OR does the patient have an upcoming appointment? Yes  Can we respond through MyChart? Yes  Agent: Please be advised that Rx refills may take up to 3 business days. We ask that you follow-up with your pharmacy.

## 2024-04-19 ENCOUNTER — Other Ambulatory Visit: Payer: Self-pay | Admitting: Family Medicine

## 2024-04-19 ENCOUNTER — Other Ambulatory Visit: Payer: Self-pay

## 2024-04-19 DIAGNOSIS — I10 Essential (primary) hypertension: Secondary | ICD-10-CM

## 2024-04-19 MED ORDER — METOPROLOL SUCCINATE ER 25 MG PO TB24: 25.0000 mg | ORAL_TABLET | Freq: Every day | ORAL | 0 refills | Status: DC

## 2024-04-19 MED ORDER — LOSARTAN POTASSIUM-HCTZ 100-25 MG PO TABS: 1.0000 | ORAL_TABLET | Freq: Every day | ORAL | 0 refills | Status: DC

## 2024-04-20 NOTE — Telephone Encounter (Signed)
 Duplicate request- filled 04/19/24 Requested Prescriptions  Pending Prescriptions Disp Refills   losartan -hydrochlorothiazide (HYZAAR) 100-25 MG tablet 90 tablet 3    Sig: Take 1 tablet by mouth daily.     Cardiovascular: ARB + Diuretic Combos Passed - 04/20/2024  2:31 PM      Passed - K in normal range and within 180 days    Potassium  Date Value Ref Range Status  01/12/2024 3.5 3.5 - 5.2 mmol/L Final         Passed - Na in normal range and within 180 days    Sodium  Date Value Ref Range Status  01/12/2024 139 134 - 144 mmol/L Final         Passed - Cr in normal range and within 180 days    Creatinine, Ser  Date Value Ref Range Status  01/12/2024 1.16 0.76 - 1.27 mg/dL Final         Passed - eGFR is 10 or above and within 180 days    eGFR  Date Value Ref Range Status  01/12/2024 78 >59 mL/min/1.73 Final         Passed - Patient is not pregnant      Passed - Last BP in normal range    BP Readings from Last 1 Encounters:  01/28/24 130/84         Passed - Valid encounter within last 6 months    Recent Outpatient Visits           2 months ago Great toe pain, left   [place] [facility], Selinda PARAS, MD   2 months ago Great toe pain, left   [facility] Primary Care & Sports Medicine at [facility], Selinda PARAS, MD   3 months ago Type 2 diabetes mellitus with complication [facility])   [place] [facility] Medicine at [facility], Selinda PARAS, MD   4 months ago Greater trochanteric pain syndrome of left lower extremity   [place] [facility], Selinda PARAS, MD   7 months ago Healthcare maintenance   [place] [facility] Care & Sports Medicine at [facility], Selinda PARAS, MD               metoprolol  succinate (TOPROL -XL) 25 MG 24 hr tablet 90 tablet 1    Sig: Take 1 tablet (25 mg total) by mouth daily.     Cardiovascular:  Beta  Blockers Passed - 04/20/2024  2:31 PM      Passed - Last BP in normal range    BP Readings from Last 1 Encounters:  01/28/24 130/84         Passed - Last Heart Rate in normal range    Pulse Readings from Last 1 Encounters:  01/28/24 63         Passed - Valid encounter within last 6 months    Recent Outpatient Visits           2 months ago Great toe pain, left   [place] [facility], Selinda PARAS, MD   2 months ago Great toe pain, left   [facility] & [facility] Medicine at [facility], Selinda PARAS, MD   3 months ago Type 2 diabetes mellitus with complication [facility])   [place] Primary Care & Sports Medicine at [facility], Jason J, MD  4 months ago Greater trochanteric pain syndrome of left lower extremity   [place] [facility] Care & Sports Medicine at [facility], Selinda PARAS, MD   7 months ago Healthcare maintenance   [facility] Primary Care & Sports Medicine at [facility], Selinda PARAS, MD

## 2024-04-20 NOTE — Telephone Encounter (Signed)
 Duplicate request- filed 04/19/24 Requested Prescriptions  Pending Prescriptions Disp Refills   losartan -hydrochlorothiazide (HYZAAR) 100-25 MG tablet 90 tablet 3    Sig: Take 1 tablet by mouth daily.     Cardiovascular: ARB + Diuretic Combos Passed - 04/20/2024  2:05 PM      Passed - K in normal range and within 180 days    Potassium  Date Value Ref Range Status  01/12/2024 3.5 3.5 - 5.2 mmol/L Final         Passed - Na in normal range and within 180 days    Sodium  Date Value Ref Range Status  01/12/2024 139 134 - 144 mmol/L Final         Passed - Cr in normal range and within 180 days    Creatinine, Ser  Date Value Ref Range Status  01/12/2024 1.16 0.76 - 1.27 mg/dL Final         Passed - eGFR is 10 or above and within 180 days    eGFR  Date Value Ref Range Status  01/12/2024 78 >59 mL/min/1.73 Final         Passed - Patient is not pregnant      Passed - Last BP in normal range    BP Readings from Last 1 Encounters:  01/28/24 130/84         Passed - Valid encounter within last 6 months    Recent Outpatient Visits           2 months ago Great toe pain, left   [place] [facility], Selinda PARAS, MD   2 months ago Great toe pain, left   [facility] at [facility], Selinda PARAS, MD   3 months ago Type 2 diabetes mellitus with complication [facility])   [place] [facility] at [facility], Selinda PARAS, MD   4 months ago Greater trochanteric pain syndrome of left lower extremity   [place] [facility], Selinda PARAS, MD   7 months ago Healthcare maintenance   [place] [facility] at [facility], Selinda PARAS, MD               metoprolol  succinate (TOPROL -XL) 25 MG 24 hr tablet 90 tablet 1    Sig: Take 1 tablet (25 mg total) by mouth daily.     Cardiovascular:  Beta  Blockers Passed - 04/20/2024  2:05 PM      Passed - Last BP in normal range    BP Readings from Last 1 Encounters:  01/28/24 130/84         Passed - Last Heart Rate in normal range    Pulse Readings from Last 1 Encounters:  01/28/24 63         Passed - Valid encounter within last 6 months    Recent Outpatient Visits           2 months ago Great toe pain, left   [place] [facility], Selinda PARAS, MD   2 months ago Great toe pain, left   [facility] at [facility], Selinda PARAS, MD   3 months ago Type 2 diabetes mellitus with complication [facility])   [place] [facility] at [facility], Jason J, MD  4 months ago Greater trochanteric pain syndrome of left lower extremity    Primary Care & Sports Medicine at [facility], Selinda PARAS, MD   7 months ago Healthcare maintenance   [facility] at [facility], Selinda PARAS, MD

## 2024-04-22 NOTE — Telephone Encounter (Signed)
 Duplicate request, refilled 04/19/24.  Requested Prescriptions  Pending Prescriptions Disp Refills   losartan -hydrochlorothiazide (HYZAAR) 100-25 MG tablet [Pharmacy Med Name: LOSARTAN /HCTZ 100/25MG  TABLETS] 90 tablet     Sig: TAKE 1 TABLET BY MOUTH DAILY     Cardiovascular: ARB + Diuretic Combos Passed - 04/22/2024 12:14 PM      Passed - K in normal range and within 180 days    Potassium  Date Value Ref Range Status  01/12/2024 3.5 3.5 - 5.2 mmol/L Final         Passed - Na in normal range and within 180 days    Sodium  Date Value Ref Range Status  01/12/2024 139 134 - 144 mmol/L Final         Passed - Cr in normal range and within 180 days    Creatinine, Ser  Date Value Ref Range Status  01/12/2024 1.16 0.76 - 1.27 mg/dL Final         Passed - eGFR is 10 or above and within 180 days    eGFR  Date Value Ref Range Status  01/12/2024 78 >59 mL/min/1.73 Final         Passed - Patient is not pregnant      Passed - Last BP in normal range    BP Readings from Last 1 Encounters:  01/28/24 130/84         Passed - Valid encounter within last 6 months    Recent Outpatient Visits           2 months ago Great toe pain, left   [place] [facility], Selinda PARAS, MD   2 months ago Great toe pain, left   [facility] at [facility], Selinda PARAS, MD   3 months ago Type 2 diabetes mellitus with complication [facility])   [place] [facility] at [facility], Selinda PARAS, MD   4 months ago Greater trochanteric pain syndrome of left lower extremity   [place] [facility], Selinda PARAS, MD   7 months ago Healthcare maintenance   [facility] at [facility], Selinda PARAS, MD

## 2024-05-16 ENCOUNTER — Other Ambulatory Visit: Payer: Self-pay | Admitting: Family Medicine

## 2024-05-16 DIAGNOSIS — I10 Essential (primary) hypertension: Secondary | ICD-10-CM

## 2024-05-17 NOTE — Telephone Encounter (Signed)
 Requested Prescriptions  Pending Prescriptions Disp Refills   losartan -hydrochlorothiazide (HYZAAR) 100-25 MG tablet [Pharmacy Med Name: LOSARTAN /HCTZ 100/25MG  TABLETS] 90 tablet 0    Sig: TAKE 1 TABLET BY MOUTH DAILY     Cardiovascular: ARB + Diuretic Combos Passed - 05/17/2024  3:27 PM      Passed - K in normal range and within 180 days    Potassium  Date Value Ref Range Status  01/12/2024 3.5 3.5 - 5.2 mmol/L Final         Passed - Na in normal range and within 180 days    Sodium  Date Value Ref Range Status  01/12/2024 139 134 - 144 mmol/L Final         Passed - Cr in normal range and within 180 days    Creatinine, Ser  Date Value Ref Range Status  01/12/2024 1.16 0.76 - 1.27 mg/dL Final         Passed - eGFR is 10 or above and within 180 days    eGFR  Date Value Ref Range Status  01/12/2024 78 >59 mL/min/1.73 Final         Passed - Patient is not pregnant      Passed - Last BP in normal range    BP Readings from Last 1 Encounters:  01/28/24 130/84         Passed - Valid encounter within last 6 months    Recent Outpatient Visits           3 months ago Great toe pain, left   [place] [facility], Selinda PARAS, MD   3 months ago Great toe pain, left   [facility] at [facility], Selinda PARAS, MD   4 months ago Type 2 diabetes mellitus with complication [facility])   [place] [facility] at [facility], Selinda PARAS, MD   5 months ago Greater trochanteric pain syndrome of left lower extremity   [place] [facility], Selinda PARAS, MD   8 months ago Healthcare maintenance   [facility] Medicine at [facility], Selinda PARAS, MD
